# Patient Record
Sex: Female | Born: 1960 | Race: White | Hispanic: No | Smoking: Never smoker
Health system: Southern US, Community
[De-identification: ages and names within clinical notes are randomized; demographics above are authoritative.]

## PROBLEM LIST (undated history)

## (undated) DIAGNOSIS — F32A Depression, unspecified: Secondary | ICD-10-CM

## (undated) DIAGNOSIS — M5430 Sciatica, unspecified side: Secondary | ICD-10-CM

## (undated) DIAGNOSIS — F419 Anxiety disorder, unspecified: Secondary | ICD-10-CM

## (undated) DIAGNOSIS — D069 Carcinoma in situ of cervix, unspecified: Secondary | ICD-10-CM

## (undated) DIAGNOSIS — E785 Hyperlipidemia, unspecified: Secondary | ICD-10-CM

## (undated) DIAGNOSIS — K449 Diaphragmatic hernia without obstruction or gangrene: Secondary | ICD-10-CM

## (undated) DIAGNOSIS — E7800 Pure hypercholesterolemia, unspecified: Secondary | ICD-10-CM

## (undated) DIAGNOSIS — I519 Heart disease, unspecified: Secondary | ICD-10-CM

## (undated) DIAGNOSIS — F909 Attention-deficit hyperactivity disorder, unspecified type: Secondary | ICD-10-CM

## (undated) HISTORY — DX: Diaphragmatic hernia without obstruction or gangrene: K44.9

## (undated) HISTORY — PX: TONSILLECTOMY: SHX5217

## (undated) HISTORY — DX: Hyperlipidemia, unspecified: E78.5

## (undated) HISTORY — PX: OTHER SURGICAL HISTORY: SHX169

## (undated) HISTORY — DX: Pure hypercholesterolemia, unspecified: E78.00

## (undated) HISTORY — DX: Heart disease, unspecified: I51.9

## (undated) HISTORY — PX: BACK SURGERY: SHX140

## (undated) HISTORY — DX: Carcinoma in situ of cervix, unspecified: D06.9

## (undated) HISTORY — DX: Sciatica, unspecified side: M54.30

## (undated) HISTORY — PX: COLPOSCOPY: SHX161

---

## 1998-09-07 ENCOUNTER — Other Ambulatory Visit: Admission: RE | Admit: 1998-09-07 | Discharge: 1998-09-07 | Payer: Self-pay | Admitting: Gynecology

## 1999-01-06 ENCOUNTER — Encounter: Admission: RE | Admit: 1999-01-06 | Discharge: 1999-04-06 | Payer: Self-pay | Admitting: Gynecology

## 1999-03-15 ENCOUNTER — Inpatient Hospital Stay (HOSPITAL_COMMUNITY): Admission: AD | Admit: 1999-03-15 | Discharge: 1999-03-18 | Payer: Self-pay | Admitting: *Deleted

## 1999-05-01 ENCOUNTER — Other Ambulatory Visit: Admission: RE | Admit: 1999-05-01 | Discharge: 1999-05-01 | Payer: Self-pay | Admitting: Gynecology

## 2000-05-23 ENCOUNTER — Other Ambulatory Visit: Admission: RE | Admit: 2000-05-23 | Discharge: 2000-05-23 | Payer: Self-pay | Admitting: Obstetrics and Gynecology

## 2000-07-01 ENCOUNTER — Other Ambulatory Visit: Admission: RE | Admit: 2000-07-01 | Discharge: 2000-07-01 | Payer: Self-pay | Admitting: Obstetrics and Gynecology

## 2001-11-21 ENCOUNTER — Other Ambulatory Visit: Admission: RE | Admit: 2001-11-21 | Discharge: 2001-11-21 | Payer: Self-pay | Admitting: Obstetrics and Gynecology

## 2003-08-06 ENCOUNTER — Other Ambulatory Visit: Admission: RE | Admit: 2003-08-06 | Discharge: 2003-08-06 | Payer: Self-pay | Admitting: Obstetrics and Gynecology

## 2003-11-13 HISTORY — PX: ENDOMETRIAL ABLATION: SHX621

## 2003-12-09 ENCOUNTER — Ambulatory Visit (HOSPITAL_BASED_OUTPATIENT_CLINIC_OR_DEPARTMENT_OTHER): Admission: RE | Admit: 2003-12-09 | Discharge: 2003-12-09 | Payer: Self-pay | Admitting: Obstetrics and Gynecology

## 2005-02-14 ENCOUNTER — Ambulatory Visit (HOSPITAL_COMMUNITY): Admission: RE | Admit: 2005-02-14 | Discharge: 2005-02-14 | Payer: Self-pay | Admitting: Addiction Medicine

## 2005-03-14 ENCOUNTER — Other Ambulatory Visit: Admission: RE | Admit: 2005-03-14 | Discharge: 2005-03-14 | Payer: Self-pay | Admitting: Obstetrics and Gynecology

## 2006-04-04 ENCOUNTER — Ambulatory Visit (HOSPITAL_COMMUNITY): Admission: RE | Admit: 2006-04-04 | Discharge: 2006-04-04 | Payer: Self-pay | Admitting: Obstetrics and Gynecology

## 2006-04-11 ENCOUNTER — Other Ambulatory Visit: Admission: RE | Admit: 2006-04-11 | Discharge: 2006-04-11 | Payer: Self-pay | Admitting: Obstetrics and Gynecology

## 2007-04-15 ENCOUNTER — Other Ambulatory Visit: Admission: RE | Admit: 2007-04-15 | Discharge: 2007-04-15 | Payer: Self-pay | Admitting: Obstetrics and Gynecology

## 2007-04-23 ENCOUNTER — Ambulatory Visit (HOSPITAL_COMMUNITY): Admission: RE | Admit: 2007-04-23 | Discharge: 2007-04-23 | Payer: Self-pay | Admitting: Obstetrics and Gynecology

## 2008-04-16 ENCOUNTER — Other Ambulatory Visit: Admission: RE | Admit: 2008-04-16 | Discharge: 2008-04-16 | Payer: Self-pay | Admitting: Obstetrics and Gynecology

## 2008-04-23 ENCOUNTER — Ambulatory Visit (HOSPITAL_COMMUNITY): Admission: RE | Admit: 2008-04-23 | Discharge: 2008-04-23 | Payer: Self-pay | Admitting: Obstetrics and Gynecology

## 2009-05-27 ENCOUNTER — Ambulatory Visit (HOSPITAL_COMMUNITY): Admission: RE | Admit: 2009-05-27 | Discharge: 2009-05-27 | Payer: Self-pay | Admitting: Obstetrics and Gynecology

## 2009-06-06 ENCOUNTER — Encounter: Admission: RE | Admit: 2009-06-06 | Discharge: 2009-06-06 | Payer: Self-pay | Admitting: Obstetrics and Gynecology

## 2009-06-15 ENCOUNTER — Other Ambulatory Visit: Admission: RE | Admit: 2009-06-15 | Discharge: 2009-06-15 | Payer: Self-pay | Admitting: Obstetrics and Gynecology

## 2009-06-15 ENCOUNTER — Encounter: Payer: Self-pay | Admitting: Obstetrics and Gynecology

## 2009-06-15 ENCOUNTER — Ambulatory Visit: Payer: Self-pay | Admitting: Obstetrics and Gynecology

## 2010-06-07 ENCOUNTER — Ambulatory Visit (HOSPITAL_COMMUNITY): Admission: RE | Admit: 2010-06-07 | Discharge: 2010-06-07 | Payer: Self-pay | Admitting: Obstetrics and Gynecology

## 2010-06-20 ENCOUNTER — Other Ambulatory Visit: Admission: RE | Admit: 2010-06-20 | Discharge: 2010-06-20 | Payer: Self-pay | Admitting: Obstetrics and Gynecology

## 2010-06-20 ENCOUNTER — Ambulatory Visit: Payer: Self-pay | Admitting: Obstetrics and Gynecology

## 2010-06-26 ENCOUNTER — Ambulatory Visit: Payer: Self-pay | Admitting: Obstetrics and Gynecology

## 2010-07-13 ENCOUNTER — Ambulatory Visit: Payer: Self-pay | Admitting: Obstetrics and Gynecology

## 2010-12-04 ENCOUNTER — Encounter: Payer: Self-pay | Admitting: Obstetrics and Gynecology

## 2011-01-19 ENCOUNTER — Other Ambulatory Visit (INDEPENDENT_AMBULATORY_CARE_PROVIDER_SITE_OTHER): Payer: BC Managed Care – PPO

## 2011-01-19 DIAGNOSIS — E7800 Pure hypercholesterolemia, unspecified: Secondary | ICD-10-CM

## 2011-03-30 NOTE — Op Note (Signed)
NAMEALIZZON, DIOGUARDI                      ACCOUNT NO.:  [id number]   MEDICAL RECORD NO.:  [id number]                   PATIENT TYPE:  AMB   LOCATION:  [facility]                                 FACILITY:  [facility]   PHYSICIAN:  Daniel L. Eda Paschal, M.D.           DATE OF BIRTH:  01/30/1961   DATE OF PROCEDURE:  12/09/2003  DATE OF DISCHARGE:                                 OPERATIVE REPORT   PREOPERATIVE DIAGNOSIS:  Menorrhagia.   POSTOPERATIVE DIAGNOSIS:  Menorrhagia.   OPERATION:  HTA endometrial ablation.   SURGEON:  Daniel L. Eda Paschal, M.D.   ANESTHESIA:  General.   INDICATIONS:  The patient is a 50 year old gravida 2, para 2, AB 0, status  post tubal ligation, who has had persistent severe menorrhagia.  She has not  tolerated oral contraceptives and therefore now enters the hospital for  endometrial ablation.  She had a normal SIH in the office as well as a  normal endometrial biopsy.   FINDINGS:  External and vaginal is normal.  Cervix is clean.  Uterus is  retroverted, normal size and shape, with 1.5 degrees of uterine descensus.  Adnexa are palpably normal.  At the time of hysteroscopy the patient had a  completely normal endometrial cavity as well as a normal endocervix.   DESCRIPTION OF PROCEDURE:  After adequate general anesthesia the patient was  placed in the dorsal lithotomy position and prepped and draped in the usual  sterile manner.  A single-tooth tenaculum was placed in the anterior lip of  the cervix.  The cervix was just slightly dilated to a #14 Pratt dilator and  then using the HTA hysteroscope and normal saline, the pressure of the  saline opened up the canal enough that the endometrial cavity could be  entered without dilating her further, and this way a very tight seal was  obtained.  A hysteroscopic examination of the cavity showed no pathology,  and then the saline was slowly and carefully heated.  When it got to 80  degrees Centigrade, at this point  the ablation was started.  There was no  leakage for the entire 10 minutes that the ablation went on of any fluid  whatsoever.  After the 10 minutes was finished, the saline was cooled, the  cavity was inspected, and then the hysteroscope was gently removed.  There  was a bleeder from the tenaculum on the outside of the cervix, which had to  be sutured with a 2-0 Vicryl.  Estimated blood loss for the entire procedure  was less than 30 mL with none replaced.  The patient tolerated the procedure  well and left the operating room in satisfactory condition.                                               Daniel L.  Eda Paschal, M.D.    Tonette Bihari  D:  12/09/2003  T:  12/09/2003  Job:  [id number]

## 2011-07-11 DIAGNOSIS — Z8632 Personal history of gestational diabetes: Secondary | ICD-10-CM | POA: Insufficient documentation

## 2011-07-11 DIAGNOSIS — D069 Carcinoma in situ of cervix, unspecified: Secondary | ICD-10-CM | POA: Insufficient documentation

## 2011-07-20 ENCOUNTER — Encounter: Payer: BC Managed Care – PPO | Admitting: Obstetrics and Gynecology

## 2011-07-20 ENCOUNTER — Ambulatory Visit (INDEPENDENT_AMBULATORY_CARE_PROVIDER_SITE_OTHER): Payer: BC Managed Care – PPO | Admitting: Obstetrics and Gynecology

## 2011-07-20 ENCOUNTER — Encounter: Payer: Self-pay | Admitting: Obstetrics and Gynecology

## 2011-07-20 ENCOUNTER — Other Ambulatory Visit (HOSPITAL_COMMUNITY)
Admission: RE | Admit: 2011-07-20 | Discharge: 2011-07-20 | Disposition: A | Discharge status: Home / Self Care | Payer: BC Managed Care – PPO | Source: Ambulatory Visit | Attending: Obstetrics and Gynecology | Admitting: Obstetrics and Gynecology

## 2011-07-20 VITALS — BP 120/80 | Ht 67.0 in | Wt 166.0 lb

## 2011-07-20 DIAGNOSIS — Z01419 Encounter for gynecological examination (general) (routine) without abnormal findings: Secondary | ICD-10-CM

## 2011-07-20 DIAGNOSIS — E7800 Pure hypercholesterolemia, unspecified: Secondary | ICD-10-CM

## 2011-07-20 DIAGNOSIS — O24419 Gestational diabetes mellitus in pregnancy, unspecified control: Secondary | ICD-10-CM

## 2011-07-20 DIAGNOSIS — O99810 Abnormal glucose complicating pregnancy: Secondary | ICD-10-CM

## 2011-07-20 NOTE — Progress Notes (Signed)
Addended by: Cammie Mcgee T on: 07/20/2011 04:43 PM   Modules accepted: Orders

## 2011-07-20 NOTE — Progress Notes (Signed)
Patient came to see me today for an annual GYN exam. She is due for her mammogram and will make appointment. She's been very happy with her Mirena IUD. She has extremely light periods. She is due to have her cholesterol rechecked because of a borderline elevation. We've checked at several times. She did not pass today.  Physical examination: HEENT within normal limits. Neck: Thyroid not large. No masses. Supraclavicular nodes: not enlarged. Breasts: Examined in both sitting midline position. No skin changes and no masses. Abdomen: Soft no guarding rebound or masses or hernia. Pelvic: External: Within normal limits. BUS: Within normal limits. Vaginal:within normal limits. Good estrogen effect. No evidence of cystocele rectocele or enterocele. Cervix: clean. IUD string visible. Uterus: Normal size and shape. Adnexa: No masses. Rectovaginal exam: Confirmatory and negative. Extremities: Within normal limits.  Assessment: 1. Menorrhagia controlled with Mirena IUD. 2. Elevated LDL.  Plan: Return fasting for lab work. If she needs treatment for her cholesterol we need to referred to a cardiologist as she does not have a PCP.

## 2011-07-20 NOTE — Patient Instructions (Addendum)
Return for lab work. Schedule Mammogram.

## 2011-07-20 NOTE — Progress Notes (Signed)
Addended by: Cammie Mcgee T on: 07/20/2011 04:58 PM   Modules accepted: Orders

## 2011-07-23 ENCOUNTER — Other Ambulatory Visit: Payer: Self-pay | Admitting: Obstetrics and Gynecology

## 2011-07-23 DIAGNOSIS — Z1231 Encounter for screening mammogram for malignant neoplasm of breast: Secondary | ICD-10-CM

## 2011-07-30 ENCOUNTER — Other Ambulatory Visit (INDEPENDENT_AMBULATORY_CARE_PROVIDER_SITE_OTHER): Payer: BC Managed Care – PPO | Admitting: Gynecology

## 2011-07-30 DIAGNOSIS — Z01419 Encounter for gynecological examination (general) (routine) without abnormal findings: Secondary | ICD-10-CM

## 2011-07-30 DIAGNOSIS — E7800 Pure hypercholesterolemia, unspecified: Secondary | ICD-10-CM

## 2011-07-30 DIAGNOSIS — O24419 Gestational diabetes mellitus in pregnancy, unspecified control: Secondary | ICD-10-CM

## 2011-08-01 ENCOUNTER — Ambulatory Visit (HOSPITAL_COMMUNITY)
Admission: RE | Admit: 2011-08-01 | Discharge: 2011-08-01 | Disposition: A | Discharge status: Home / Self Care | Payer: BC Managed Care – PPO | Source: Ambulatory Visit | Attending: Obstetrics and Gynecology | Admitting: Obstetrics and Gynecology

## 2011-08-01 DIAGNOSIS — Z1231 Encounter for screening mammogram for malignant neoplasm of breast: Secondary | ICD-10-CM | POA: Insufficient documentation

## 2011-08-07 ENCOUNTER — Other Ambulatory Visit: Payer: Self-pay | Admitting: Obstetrics and Gynecology

## 2011-08-09 ENCOUNTER — Other Ambulatory Visit: Payer: Self-pay | Admitting: *Deleted

## 2011-08-09 MED ORDER — ACYCLOVIR 5 % EX OINT: TOPICAL_OINTMENT | CUTANEOUS | Status: DC

## 2011-08-09 NOTE — Telephone Encounter (Signed)
Okay to refill Zovirax ointment.

## 2011-08-09 NOTE — Telephone Encounter (Signed)
Patient requesting a refill on Zovirax.  She had been given a tube some time back for cold sores, and needs it refilled please.

## 2011-08-09 NOTE — Telephone Encounter (Signed)
Patient informed rx called in.

## 2011-08-15 ENCOUNTER — Other Ambulatory Visit: Payer: Self-pay | Admitting: *Deleted

## 2011-08-15 ENCOUNTER — Ambulatory Visit (HOSPITAL_COMMUNITY): Admission: RE | Admit: 2011-08-15 | Payer: BC Managed Care – PPO | Source: Ambulatory Visit

## 2011-08-15 DIAGNOSIS — N649 Disorder of breast, unspecified: Secondary | ICD-10-CM

## 2011-08-24 ENCOUNTER — Encounter: Payer: BC Managed Care – PPO | Admitting: Obstetrics and Gynecology

## 2011-08-27 ENCOUNTER — Ambulatory Visit
Admission: RE | Admit: 2011-08-27 | Discharge: 2011-08-27 | Disposition: A | Discharge status: Home / Self Care | Payer: BC Managed Care – PPO | Source: Ambulatory Visit | Attending: Obstetrics and Gynecology | Admitting: Obstetrics and Gynecology

## 2011-08-27 DIAGNOSIS — N649 Disorder of breast, unspecified: Secondary | ICD-10-CM

## 2012-06-26 ENCOUNTER — Other Ambulatory Visit: Payer: Self-pay | Admitting: *Deleted

## 2012-06-26 DIAGNOSIS — Z1231 Encounter for screening mammogram for malignant neoplasm of breast: Secondary | ICD-10-CM

## 2012-06-26 DIAGNOSIS — N6009 Solitary cyst of unspecified breast: Secondary | ICD-10-CM

## 2012-07-21 ENCOUNTER — Ambulatory Visit (INDEPENDENT_AMBULATORY_CARE_PROVIDER_SITE_OTHER): Payer: BC Managed Care – PPO | Admitting: Obstetrics and Gynecology

## 2012-07-21 ENCOUNTER — Encounter: Payer: Self-pay | Admitting: Obstetrics and Gynecology

## 2012-07-21 VITALS — BP 120/74 | Ht 66.5 in | Wt 161.0 lb

## 2012-07-21 DIAGNOSIS — E7800 Pure hypercholesterolemia, unspecified: Secondary | ICD-10-CM

## 2012-07-21 DIAGNOSIS — Z833 Family history of diabetes mellitus: Secondary | ICD-10-CM

## 2012-07-21 DIAGNOSIS — Z01419 Encounter for gynecological examination (general) (routine) without abnormal findings: Secondary | ICD-10-CM

## 2012-07-21 NOTE — Progress Notes (Signed)
Patient came to see me today for her annual GYN exam. She continues to have amenorrhea on her Mirena IUD. She has scheduled her mammogram. She has a mother who died suddenly at 49 years old. She is now jogging. She had elevated lipids last year. She thinks she eats well. She has no abnormal bleeding. She is having no pelvic pain. 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 In 2009 she had a Pap smear which showed ascus. It was repeated and still showed ascus. High risk HPV was not detected. Colposcopy with biopsy was negative. All her other Pap smears after laser of the cervix before and after 2009 were normal. Her fasting cholesterol last year was 212 with an HDL of 58 and an LDL of 144. Her triglycerides were 49. Her last Pap smear was 2012.  Physical examination:Teresa Christensen present. HEENT within normal limits. Neck: Thyroid not large. No masses. Supraclavicular nodes: not enlarged. Breasts: Examined in both sitting and lying  position. No skin changes and no masses. Abdomen: Soft no guarding rebound or masses or hernia. Pelvic: External: Within normal limits. BUS: Within normal limits. Vaginal:within normal limits. Good estrogen effect. No evidence of cystocele rectocele or enterocele. Cervix: clean. IUD string visible. Uterus: Normal size and shape. Adnexa: No masses. Rectovaginal exam: Confirmatory and negative. Extremities: Within normal limits.  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 Elevated lipids with mother who died of coronary artery disease at 57  Plan: Mammogram. Return fasting for lipid profile. If the profile normal schedule cardiac stress test through PCP. If lipids elevated refer to  Cardiologist. The new Pap smear guidelines were discussed with the patient. No pap  done.

## 2012-07-21 NOTE — Patient Instructions (Signed)
Return fasting for lipid profile.

## 2012-07-22 LAB — URINALYSIS W MICROSCOPIC + REFLEX CULTURE
Bacteria, UA: NONE SEEN
Bilirubin Urine: NEGATIVE
Casts: NONE SEEN
Crystals: NONE SEEN
Glucose, UA: NEGATIVE mg/dL
Hgb urine dipstick: NEGATIVE
Ketones, ur: NEGATIVE mg/dL
Specific Gravity, Urine: 1.01 (ref 1.005–1.030)
pH: 6 (ref 5.0–8.0)

## 2012-07-24 LAB — URINE CULTURE: Colony Count: 75000

## 2012-07-25 ENCOUNTER — Other Ambulatory Visit: Payer: Self-pay | Admitting: Obstetrics and Gynecology

## 2012-07-25 DIAGNOSIS — N390 Urinary tract infection, site not specified: Secondary | ICD-10-CM

## 2012-07-25 MED ORDER — AMOXICILLIN 500 MG PO CAPS: 500.0000 mg | ORAL_CAPSULE | Freq: Three times a day (TID) | ORAL | Status: AC

## 2012-08-04 ENCOUNTER — Ambulatory Visit
Admission: RE | Admit: 2012-08-04 | Discharge: 2012-08-04 | Disposition: A | Discharge status: Home / Self Care | Payer: BC Managed Care – PPO | Source: Ambulatory Visit | Attending: Obstetrics and Gynecology | Admitting: Obstetrics and Gynecology

## 2012-08-04 DIAGNOSIS — N6009 Solitary cyst of unspecified breast: Secondary | ICD-10-CM

## 2012-08-04 DIAGNOSIS — Z1231 Encounter for screening mammogram for malignant neoplasm of breast: Secondary | ICD-10-CM

## 2012-08-07 ENCOUNTER — Other Ambulatory Visit: Payer: BC Managed Care – PPO

## 2012-08-07 DIAGNOSIS — N390 Urinary tract infection, site not specified: Secondary | ICD-10-CM

## 2012-08-07 DIAGNOSIS — Z01419 Encounter for gynecological examination (general) (routine) without abnormal findings: Secondary | ICD-10-CM

## 2012-08-07 DIAGNOSIS — Z833 Family history of diabetes mellitus: Secondary | ICD-10-CM

## 2012-08-07 DIAGNOSIS — E7800 Pure hypercholesterolemia, unspecified: Secondary | ICD-10-CM

## 2012-08-07 LAB — CBC WITH DIFFERENTIAL/PLATELET
Basophils Absolute: 0 10*3/uL (ref 0.0–0.1)
Basophils Relative: 1 % (ref 0–1)
MCHC: 35.3 g/dL (ref 30.0–36.0)
Monocytes Relative: 6 % (ref 3–12)
Neutro Abs: 4 10*3/uL (ref 1.7–7.7)
Neutrophils Relative %: 62 % (ref 43–77)
RBC: 4.35 MIL/uL (ref 3.87–5.11)
WBC: 6.4 10*3/uL (ref 4.0–10.5)

## 2012-08-07 LAB — HEMOGLOBIN A1C
Hgb A1c MFr Bld: 5.7 % — ABNORMAL HIGH (ref <5.7)
Mean Plasma Glucose: 117 mg/dL — ABNORMAL HIGH (ref <117)

## 2012-08-07 LAB — LIPID PANEL
Cholesterol: 203 mg/dL — ABNORMAL HIGH (ref 0–200)
HDL: 60 mg/dL (ref >39)
Total CHOL/HDL Ratio: 3.4 Ratio

## 2012-08-09 LAB — URINE CULTURE
Colony Count: NO GROWTH
Organism ID, Bacteria: NO GROWTH

## 2013-03-03 ENCOUNTER — Other Ambulatory Visit (HOSPITAL_BASED_OUTPATIENT_CLINIC_OR_DEPARTMENT_OTHER): Payer: Self-pay | Admitting: Family Medicine

## 2013-03-03 ENCOUNTER — Ambulatory Visit (HOSPITAL_BASED_OUTPATIENT_CLINIC_OR_DEPARTMENT_OTHER)
Admission: RE | Admit: 2013-03-03 | Discharge: 2013-03-03 | Disposition: A | Discharge status: Home / Self Care | Payer: BC Managed Care – PPO | Source: Ambulatory Visit | Attending: Family Medicine | Admitting: Family Medicine

## 2013-03-03 DIAGNOSIS — N201 Calculus of ureter: Secondary | ICD-10-CM | POA: Insufficient documentation

## 2013-03-03 DIAGNOSIS — R109 Unspecified abdominal pain: Secondary | ICD-10-CM

## 2013-03-03 DIAGNOSIS — N200 Calculus of kidney: Secondary | ICD-10-CM | POA: Insufficient documentation

## 2013-03-12 ENCOUNTER — Other Ambulatory Visit: Payer: Self-pay | Admitting: Obstetrics and Gynecology

## 2013-06-29 ENCOUNTER — Other Ambulatory Visit: Payer: Self-pay | Admitting: Gynecology

## 2013-06-29 DIAGNOSIS — N6320 Unspecified lump in the left breast, unspecified quadrant: Secondary | ICD-10-CM

## 2013-08-10 ENCOUNTER — Encounter: Payer: Self-pay | Admitting: Gynecology

## 2013-08-10 ENCOUNTER — Ambulatory Visit
Admission: RE | Admit: 2013-08-10 | Discharge: 2013-08-10 | Disposition: A | Discharge status: Home / Self Care | Payer: BC Managed Care – PPO | Source: Ambulatory Visit | Attending: Gynecology | Admitting: Gynecology

## 2013-08-10 ENCOUNTER — Other Ambulatory Visit: Payer: BC Managed Care – PPO

## 2013-08-10 DIAGNOSIS — N6320 Unspecified lump in the left breast, unspecified quadrant: Secondary | ICD-10-CM

## 2013-08-12 DIAGNOSIS — E785 Hyperlipidemia, unspecified: Secondary | ICD-10-CM

## 2013-08-12 HISTORY — DX: Hyperlipidemia, unspecified: E78.5

## 2013-08-21 ENCOUNTER — Other Ambulatory Visit (HOSPITAL_COMMUNITY)
Admission: RE | Admit: 2013-08-21 | Discharge: 2013-08-21 | Disposition: A | Discharge status: Home / Self Care | Payer: BC Managed Care – PPO | Source: Ambulatory Visit | Attending: Gynecology | Admitting: Gynecology

## 2013-08-21 ENCOUNTER — Encounter: Payer: Self-pay | Admitting: Gynecology

## 2013-08-21 ENCOUNTER — Ambulatory Visit (INDEPENDENT_AMBULATORY_CARE_PROVIDER_SITE_OTHER): Payer: BC Managed Care – PPO | Admitting: Gynecology

## 2013-08-21 VITALS — BP 124/70 | Ht 66.25 in | Wt 169.0 lb

## 2013-08-21 DIAGNOSIS — Z1151 Encounter for screening for human papillomavirus (HPV): Secondary | ICD-10-CM | POA: Insufficient documentation

## 2013-08-21 DIAGNOSIS — Z01419 Encounter for gynecological examination (general) (routine) without abnormal findings: Secondary | ICD-10-CM

## 2013-08-21 DIAGNOSIS — Z23 Encounter for immunization: Secondary | ICD-10-CM

## 2013-08-21 DIAGNOSIS — Z1159 Encounter for screening for other viral diseases: Secondary | ICD-10-CM

## 2013-08-21 NOTE — Patient Instructions (Signed)
Influenza Vaccine (Flu Vaccine, Inactivated) 2013 2014 What You Need to Know WHY GET VACCINATED?  Influenza ("flu") is a contagious disease that spreads around the [place] every winter, usually between October and May.  Flu is caused by the influenza virus, and can be spread by coughing, sneezing, and close contact.  Anyone can get flu, but the risk of getting flu is highest among children. Symptoms come on suddenly and may last several days. They can include:  Fever or chills.  Sore throat.  Muscle aches.  Fatigue.  Cough.  Headache.  Runny or stuffy nose. Flu can make some people much sicker than others. These people include young children, people 65 and older, pregnant women, and people with certain health conditions such as heart, lung or kidney disease, or a weakened immune system. Flu vaccine is especially important for these people, and anyone in close contact with them. Flu can also lead to pneumonia, and make existing medical conditions worse. It can cause diarrhea and seizures in children. Each year thousands of people in the [place] die from flu, and many more are hospitalized. Flu vaccine is the best protection we have from flu and its complications. Flu vaccine also helps prevent spreading flu from person to person. INACTIVATED FLU VACCINE There are 2 types of influenza vaccine:  You are getting an inactivated flu vaccine, which does not contain any live influenza virus. It is given by injection with a needle, and often called the "flu shot."  A different live, attenuated (weakened) influenza vaccine is sprayed into the nostrils. This vaccine is described in a separate Vaccine Information Statement. Flu vaccine is recommended every year. Children 6 months through 8 years of age should get 2 doses the first year they get vaccinated. Flu viruses are always changing. Each year's flu vaccine is made to protect from viruses that are most likely to cause disease  that year. While flu vaccine cannot prevent all cases of flu, it is our best defense against the disease. Inactivated flu vaccine protects against 3 or 4 different influenza viruses. It takes about 2 weeks for protection to develop after the vaccination, and protection lasts several months to a year. Some illnesses that are not caused by influenza virus are often mistaken for flu. Flu vaccine will not prevent these illnesses. It can only prevent influenza. A "high-dose" flu vaccine is available for people 65 years of age and older. The person giving you the vaccine can tell you more about it. Some inactivated flu vaccine contains a very small amount of a mercury-based preservative called thimerosal. Studies have shown that thimerosal in vaccines is not harmful, but flu vaccines that do not contain a preservative are available. SOME PEOPLE SHOULD NOT GET THIS VACCINE Tell the person who gives you the vaccine:  If you have any severe (life-threatening) allergies. If you ever had a life-threatening allergic reaction after a dose of flu vaccine, or have a severe allergy to any part of this vaccine, you may be advised not to get a dose. Most, but not all, types of flu vaccine contain a small amount of egg.  If you ever had Guillain Barr Syndrome (a severe paralyzing illness, also called GBS). Some people with a history of GBS should not get this vaccine. This should be discussed with your doctor.  If you are not feeling well. They might suggest waiting until you feel better. But you should come back. RISKS OF A VACCINE REACTION With a vaccine, like any medicine, there   is a chance of side effects. These are usually mild and go away on their own. Serious side effects are also possible, but are very rare. Inactivated flu vaccine does not contain live flu virus, sogetting flu from this vaccine is not possible. Brief fainting spells and related symptoms (such as jerking movements) can happen after any medical  procedure, including vaccination. Sitting or lying down for about 15 minutes after a vaccination can help prevent fainting and injuries caused by falls. Tell your doctor if you feel dizzy or lightheaded, or have vision changes or ringing in the ears. Mild problems following inactivated flu vaccine:  Soreness, redness, or swelling where the shot was given.  Hoarseness; sore, red or itchy eyes; or cough.  Fever.  Aches.  Headache.  Itching.  Fatigue. If these problems occur, they usually begin soon after the shot and last 1 or 2 days. Moderate problems following inactivated flu vaccine:  Young children who get inactivated flu vaccine and pneumococcal vaccine (PCV13) at the same time may be at increased risk for seizures caused by fever. Ask your doctor for more information. Tell your doctor if a child who is getting flu vaccine has ever had a seizure. Severe problems following inactivated flu vaccine:  A severe allergic reaction could occur after any vaccine (estimated less than 1 in a million doses).  There is a small possibility that inactivated flu vaccine could be associated with Guillan Barr Syndrome (GBS), no more than 1 or 2 cases per million people vaccinated. This is much lower than the risk of severe complications from flu, which can be prevented by flu vaccine. The safety of vaccines is always being monitored. For more information, visit: www.cdc.gov/vaccinesafety/ WHAT IF THERE IS A SERIOUS REACTION? What should I look for?  Look for anything that concerns you, such as signs of a severe allergic reaction, very high fever, or behavior changes. Signs of a severe allergic reaction can include hives, swelling of the face and throat, difficulty breathing, a fast heartbeat, dizziness, and weakness. These would start a few minutes to a few hours after the vaccination. What should I do?  If you think it is a severe allergic reaction or other emergency that cannot wait, call 9 1 1  or get the person to the nearest hospital. Otherwise, call your doctor.  Afterward, the reaction should be reported to the Vaccine Adverse Event Reporting System (VAERS). Your doctor might file this report, or you can do it yourself through the VAERS website at www.vaers.hhs.gov, or by calling 1-800-822-7967. VAERS is only for reporting reactions. They do not give medical advice. THE NATIONAL VACCINE INJURY COMPENSATION PROGRAM The National Vaccine Injury Compensation Program (VICP) is a federal program that was created to compensate people who may have been injured by certain vaccines. Persons who believe they may have been injured by a vaccine can learn about the program and about filing a claim by calling 1-800-338-2382 or visiting the VICP website at www.hrsa.gov/vaccinecompensation HOW CAN I LEARN MORE?  Ask your doctor.  Call your local or state health department.  Contact the Centers for Disease Control and Prevention (CDC):  Call 1-800-232-4636 (1-800-CDC-INFO) or  Visit CDC's website at www.cdc.gov/flu CDC Inactivated Influenza Vaccine Interim VIS (06/06/12) Document Released: 08/23/2006 Document Revised: 07/23/2012 Document Reviewed: 06/06/2012 Exit[employer] Patient Information 2014 [employer].  

## 2013-08-21 NOTE — Progress Notes (Signed)
Teresa Christensen Mar 28, 1961 [id number]   History:    52 y.o.  for annual gyn exam with no complaints today. Patient was last seen in the office in September 2013. She is doing well with a Mirena IUD there was placing 2011 and is having no menstrual cycles. 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 In 2009 she had a Pap smear which showed ascus. It was repeated and still showed ascus. High risk HPV was not detected. Colposcopy with biopsy was negative. All her other Pap smears after laser of the cervix before and after 2009 were normal. Her fasting cholesterol last year was 212 with an HDL of 58 and an LDL of 144. Her triglycerides were 49. Her last Pap smear was 2012.   Patient's mammogram and ultrasound this year with the following results:  Ultrasound is performed, showing a circumscribed ovoid  homogeneously hypoechoic solid mass with increased through  transmission at the 3 o'clock position of the left breast 3 cm from  the nipple. 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   IMPRESSION:  Stable 5 x 7 x 9 mm well defined ovoid homogeneously hypoechoic  solid mass at the 3 o'clock position of the left breast 3 cm from  the nipple. 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     Past medical history,surgical history, family history and social history were all reviewed and documented in the EPIC chart.  Gynecologic History No LMP recorded. Patient is not currently having periods (Reason: IUD). Contraception: IUD Last Pap: 2012. Results were: normal Last mammogram: see above. Results were: see above  Obstetric History OB History  Gravida Para Term Preterm AB SAB TAB Ectopic Multiple Living  3 2   1     2     # Outcome Date GA Lbr Len/2nd Weight Sex Delivery Anes PTL Lv  3 ABT           2 PAR           1 PAR                ROS: A ROS was performed  and pertinent positives and negatives are included in the history.  GENERAL: No fevers or chills. HEENT: No change in vision, no earache, sore throat or sinus congestion. NECK: No pain or stiffness. CARDIOVASCULAR: No chest pain or pressure. No palpitations. PULMONARY: No shortness of breath, cough or wheeze. GASTROINTESTINAL: No abdominal pain, nausea, vomiting or diarrhea, melena or bright red blood per rectum. GENITOURINARY: No urinary frequency, urgency, hesitancy or dysuria. MUSCULOSKELETAL: No joint or muscle pain, no back pain, no recent trauma. DERMATOLOGIC: No rash, no itching, no lesions. ENDOCRINE: No polyuria, polydipsia, no heat or cold intolerance. No recent change in weight. HEMATOLOGICAL: No anemia or easy bruising or bleeding. NEUROLOGIC: No headache, seizures, numbness, tingling or weakness. PSYCHIATRIC: No depression, no loss of interest in normal activity or change in sleep pattern.     Exam: chaperone present  BP 124/70  Ht 5' 6.25" (1.683 m)  Wt 169 lb (76.658 kg)  BMI 27.06 kg/m2  Body mass index is 27.06 kg/(m^2).  General appearance : Well developed well nourished female. No acute distress HEENT: Neck supple, trachea midline, no carotid bruits, no thyroidmegaly Lungs: Clear to auscultation, no rhonchi or wheezes, or rib retractions  Heart: Regular rate and rhythm, no  murmurs or gallops Breast:Examined in sitting and supine position were symmetrical in appearance, no palpable masses or tenderness,  no skin retraction, no nipple inversion, no nipple discharge, no skin discoloration, no axillary or supraclavicular lymphadenopathy Abdomen: no palpable masses or tenderness, no rebound or guarding Extremities: no edema or skin discoloration or tenderness  Pelvic:  Bartholin, Urethra, Skene Glands: Within normal limits             Vagina: No gross lesions or discharge  Cervix: No gross lesions or discharge, IUD string seen  Uterus  anteverted, normal size, shape and  consistency, non-tender and mobile  Adnexa  Without masses or tenderness  Anus and perineum  normal   Rectovaginal  normal sphincter tone without palpated masses or tenderness             Hemoccult none indicated     Assessment/Plan:  52 y.o. female for annual exam who was reminded on importance of regular exercise, calcium and vitamin D for osteoporosis prevention. Pap smear was done today. The following labs were ordered: CBC, fasting lipid profile, TSH, comprehensive metabolic panel, urinalysis and flu vaccine.Patient will needs screening colonoscopy next year.  New CDC guidelines is recommending patients be tested once in her lifetime for hepatitis C antibody who were born between [birth year] through 1963. This was discussed with the patient today and has agreed to be tested today.  Note: This dictation was prepared with  Dragon/digital dictation along withSmart phrase technology. Any transcriptional errors that result from this process are unintentional.   Ok Edwards MD, 5:37 PM 08/21/2013

## 2013-09-02 ENCOUNTER — Other Ambulatory Visit: Payer: BC Managed Care – PPO

## 2013-09-02 DIAGNOSIS — Z01419 Encounter for gynecological examination (general) (routine) without abnormal findings: Secondary | ICD-10-CM

## 2013-09-02 DIAGNOSIS — Z1159 Encounter for screening for other viral diseases: Secondary | ICD-10-CM

## 2013-09-02 LAB — CBC WITH DIFFERENTIAL/PLATELET
Basophils Absolute: 0 10*3/uL (ref 0.0–0.1)
Basophils Relative: 1 % (ref 0–1)
Hemoglobin: 13.5 g/dL (ref 12.0–15.0)
MCHC: 35.2 g/dL (ref 30.0–36.0)
Neutro Abs: 4.5 10*3/uL (ref 1.7–7.7)
Neutrophils Relative %: 66 % (ref 43–77)
Platelets: 227 10*3/uL (ref 150–400)
RDW: 13.2 % (ref 11.5–15.5)

## 2013-09-02 LAB — COMPREHENSIVE METABOLIC PANEL
AST: 13 U/L (ref 0–37)
Albumin: 3.8 g/dL (ref 3.5–5.2)
Alkaline Phosphatase: 38 U/L — ABNORMAL LOW (ref 39–117)
Potassium: 4.5 mEq/L (ref 3.5–5.3)
Sodium: 140 mEq/L (ref 135–145)
Total Protein: 6 g/dL (ref 6.0–8.3)

## 2013-09-02 LAB — LIPID PANEL
HDL: 53 mg/dL (ref >39)
LDL Cholesterol: 135 mg/dL — ABNORMAL HIGH (ref 0–99)
VLDL: 10 mg/dL (ref 0–40)

## 2013-09-02 LAB — TSH: TSH: 1.186 u[IU]/mL (ref 0.350–4.500)

## 2013-09-04 ENCOUNTER — Other Ambulatory Visit: Payer: Self-pay | Admitting: *Deleted

## 2013-09-04 DIAGNOSIS — E7800 Pure hypercholesterolemia, unspecified: Secondary | ICD-10-CM

## 2013-09-12 HISTORY — PX: OTHER SURGICAL HISTORY: SHX169

## 2013-09-15 ENCOUNTER — Ambulatory Visit (INDEPENDENT_AMBULATORY_CARE_PROVIDER_SITE_OTHER): Payer: BC Managed Care – PPO | Admitting: Cardiology

## 2013-09-15 ENCOUNTER — Encounter: Payer: Self-pay | Admitting: Cardiology

## 2013-09-15 VITALS — BP 110/76 | HR 60 | Ht 67.0 in | Wt 170.0 lb

## 2013-09-15 DIAGNOSIS — E785 Hyperlipidemia, unspecified: Secondary | ICD-10-CM | POA: Insufficient documentation

## 2013-09-15 DIAGNOSIS — Z8249 Family history of ischemic heart disease and other diseases of the circulatory system: Secondary | ICD-10-CM | POA: Insufficient documentation

## 2013-09-15 DIAGNOSIS — R0609 Other forms of dyspnea: Secondary | ICD-10-CM | POA: Insufficient documentation

## 2013-09-15 NOTE — Patient Instructions (Signed)
Bike test  Here in the office.  Your physician recommends that you schedule a follow-up appointment  After CPET/MET is completed.

## 2013-09-15 NOTE — Progress Notes (Signed)
PCP: Lenora Boys, MD  Clinic Note: Chief Complaint  Patient presents with  . Follow-up    high LDL    HPI: Teresa Christensen is a 52 y.o. female with a PMH below who presents today for evaluation of hyperlipidemia in setting of family history of early MI in mother at age 33.  Also with some decreased exercise.  Patient states she has had no symptoms such as chest pain, dizziness, lightheadedness, dizziness.  She has noted some decreased exercise tolerance with some mild exertional dyspnea of late, but denies fatigue. She states 2 years ago her cholesterol was checked by her ob/gyn and her LDL was elevated although she annot recall specific numbers. She states her total cholesterol and HDL were "normal at that time". She is a very active person and recently ran a half marathon. She exercises 3-4x a week and maintains a BMI <27.5. She tries to eat healthy including eating a large portion of fruits and vegetables.   At her most recent visit with PCP office, she was noted  To have "LDL was 135, otherwise normal". Patient was told her total cholesterol and HDL were excellent.  Patient was very concerned that her LDL had not improved with her active lifestyle and given the fact that her mother had an MI at 29  (when patient was 4) and she is now 48. This topic is a very emotional one for her. Of note, her mother was obese, inactive, a smoker, likely had higher cholesterol levels and may have had high blood pressure as well.   The remainder of cardiac review of systems is as follows: Cardiovascular ROS: positive for - dyspnea on exertion and mildly decreased exercise tolerance negative for - chest pain, edema, irregular heartbeat, loss of consciousness, murmur, orthopnea, palpitations, paroxysmal nocturnal dyspnea, rapid heart rate or shortness of breath,   Past Medical History  Diagnosis Date  . CIN III (cervical intraepithelial neoplasia grade III) with severe dysplasia   . High cholesterol      Prior Cardiac Evaluation and Past Surgical History: Past Surgical History  Procedure Laterality Date  . Tonsillectomy    . Laser vaporization of cervix    . Endometrial ablation  11/2003  . Intrauterine device insertion  07/2010    mirena  . Colposcopy      No Known Allergies  Current Outpatient Prescriptions  Medication Sig Dispense Refill  . cetirizine (ZYRTEC) 10 MG tablet Take 10 mg by mouth daily. prn      . Ibuprofen (ADVIL PO) Take by mouth. PRN       . levonorgestrel (MIRENA) 20 MCG/24HR IUD 1 each by Intrauterine route once.        . Multiple Vitamin (MULTIVITAMIN) capsule Take 1 capsule by mouth daily.        . Levocetirizine Dihydrochloride (XYZAL PO) Take by mouth. PRN      . ZOVIRAX 5 % APPLY TOPICALLY EVERY 3 (THREE) HOURS.  30 g  2   No current facility-administered medications for this visit.   Social History  . Marital Status: Married  Has 2 children age 10 and 39  Social History Main Topics  . Smoking status: Never Smoker   . Smokeless tobacco: Never Used  . Alcohol Use: 1.0 oz/week    2 drink(s) per week  . Drug Use: No  . Sexual Activity: Yes    Birth Control/ Protection: IUD     Comment: Mirena inserted 07-13-10   Family History  Problem Relation  Age of Onset  . Hypertension Mother   . Heart disease Mother     DIED WITH MI AT AGE 69--WAS A SMOKER  . Hypertension Father   . Hypertension Sister   . Multiple sclerosis Brother   . Breast cancer Maternal Grandmother     Age 25    ROS: A comprehensive Review of Systems - was negative  PHYSICAL EXAM BP 110/76  Pulse 60  Ht 5\' 7"  (1.702 m)  Wt 170 lb (77.111 kg)  BMI 26.62 kg/m2 Gen: NAD, resting comfortably on table, appears stated age Neck: no carotid bruit CV: regular rate and rhythm, no murmurs rubs or gallops.  Pulm: clear to auscultation bilaterally Ext: no edema Neuro: moves all extremities, grossly intact  EKG:  Date: 09/15/2013  EKG Time: 9:21 AM  Rate: 60  Rhythm: normal  sinus rhythm,  normal EKG, normal sinus rhythm  Axis: normal  Intervals:normal intervals  ST&T Change: none noted  Narrative Interpretation: normal EKG in NSR with no signs of ischemia   Recent Labs: From PCP note "LDL was 135, otherwise normal"  ASSESSMENT / PLAN:  Hyperlipidemia Cannot formally calculate ASCVD Risk but using HDL of 50 and total cholesterol of 190, risk would be 1% and statin would not be recommended over diet & exercise. In addition, per former guidelines and only risk factor of family history, would recommend LDL less than 160 which patient has obtained. Patient should continue efforts at healthy lifestyle modifications through diet and exercise. Agree with PCP to not initiate statin at this time but this can be followed yearly by PCP.   Family history early MI - with concern for decreased exercise tolerance Believe patient risk of CV event is low given that she is taking care of herself (not smoking like her mother did, exercising, eating well). Given hyperlipidemia and family history of MI, have discussed with patient and opted to proceed forward with Cardiopulmonary Exercise Testing (CPET-MET test).   Followup: prn or if any concerning findings found on CPET  Dr. Herbie Baltimore has seen and evaluated the patient. We have discussed the history, exam, assessment, and plan as noted above. He agrees with management.   Aldine Contes. Marti Sleigh, MD, PGY3 [facility] 09/15/2013 9:29 AM   ATTENDING ATTESTATION I seen and examined the patient along with Dr. Durene Cal. I reviewed her chart as well as his findings. I agree with his findings, examination, as well as impression and recommendations. We have discussed the plan as noted above. Additional problem list based plan below.  Hyperlipemia Very portal line numbers. I agree with Dr. Durene Cal as far as the patient's risk calculation. I would like to risk stratify her from a physiological standpoint.  Plan: For  now continue with lifestyle modification for lipid control. Her HDL was fine, and her LDL is perhaps borderline elevated. Would preserve statin therapy if she were to have worsening lipid panel, or abnormal findings on CPET (Cardiopulmonary Exercise Test)  Exertional dyspnea My estimation of this decreased exercise tolerance and exertional dyspnea is probably just because she has been doing less exercise of late than she used to. The patient like to get back to doing her routine exercise, but is fearful of her cardiovascular risk given her family history.  I feel that determining her baseline cardiovascular risk when there is equivocal/borderline lipid findings and family history of premature coronary disease is a reasonable objective. I do best type of tests to give [place] a physiological data to risk out  by her from a cardiac standpoint would be a Cardiopulmonary Exercise Test (CPET-MET)  Family history of early CAD This is really her major cardiovascular risk factor besides gestational diabetes. However as she is approaching needs that her mother was when she had her first MI, and there is definite fear that she would suffer the same fate.    Risk stratify with  CPET-MET  Pending the outcome of her stress test, I agree with when necessary follow-up.    DAVID W. Herbie Baltimore, M.D., M.S. [facility] [address] [place], [place]  [address]  [phone] Pager # [phone]

## 2013-09-15 NOTE — Assessment & Plan Note (Signed)
My estimation of this decreased exercise tolerance and exertional dyspnea is probably just because she has been doing less exercise of late than she used to. The patient like to get back to doing her routine exercise, but is fearful of her cardiovascular risk given her family history.  I feel that determining her baseline cardiovascular risk when there is equivocal/borderline lipid findings and family history of premature coronary disease is a reasonable objective. I do best type of tests to give [place] a physiological data to risk out by her from a cardiac standpoint would be a Cardiopulmonary Exercise Test (CPET-MET)

## 2013-09-15 NOTE — Assessment & Plan Note (Signed)
Very portal line numbers. I agree with Dr. Durene Cal as far as the patient's risk calculation. I would like to risk stratify her from a physiological standpoint.  Plan: For now continue with lifestyle modification for lipid control. Her HDL was fine, and her LDL is perhaps borderline elevated. Would preserve statin therapy if she were to have worsening lipid panel, or abnormal findings on CPET (Cardiopulmonary Exercise Test)

## 2013-09-15 NOTE — Assessment & Plan Note (Addendum)
This is really her major cardiovascular risk factor besides gestational diabetes. However as she is approaching needs that her mother was when she had her first MI, and there is definite fear that she would suffer the same fate.    Risk stratify with  CPET-MET  Pending the outcome of her stress test, I agree with when necessary follow-up.

## 2013-09-28 ENCOUNTER — Encounter (INDEPENDENT_AMBULATORY_CARE_PROVIDER_SITE_OTHER): Payer: BC Managed Care – PPO

## 2013-09-28 DIAGNOSIS — Z8249 Family history of ischemic heart disease and other diseases of the circulatory system: Secondary | ICD-10-CM

## 2013-09-28 DIAGNOSIS — E785 Hyperlipidemia, unspecified: Secondary | ICD-10-CM

## 2013-09-28 DIAGNOSIS — R9439 Abnormal result of other cardiovascular function study: Secondary | ICD-10-CM | POA: Insufficient documentation

## 2013-09-28 DIAGNOSIS — R0602 Shortness of breath: Secondary | ICD-10-CM

## 2013-09-29 ENCOUNTER — Encounter: Payer: Self-pay | Admitting: Cardiology

## 2013-10-05 ENCOUNTER — Encounter: Payer: Self-pay | Admitting: Cardiology

## 2013-10-05 ENCOUNTER — Ambulatory Visit (INDEPENDENT_AMBULATORY_CARE_PROVIDER_SITE_OTHER): Payer: BC Managed Care – PPO | Admitting: Cardiology

## 2013-10-05 VITALS — BP 136/78 | HR 65 | Ht 66.5 in | Wt 172.0 lb

## 2013-10-05 DIAGNOSIS — Z79899 Other long term (current) drug therapy: Secondary | ICD-10-CM

## 2013-10-05 DIAGNOSIS — E782 Mixed hyperlipidemia: Secondary | ICD-10-CM

## 2013-10-05 DIAGNOSIS — E7800 Pure hypercholesterolemia, unspecified: Secondary | ICD-10-CM

## 2013-10-05 DIAGNOSIS — R9439 Abnormal result of other cardiovascular function study: Secondary | ICD-10-CM

## 2013-10-05 DIAGNOSIS — Z8249 Family history of ischemic heart disease and other diseases of the circulatory system: Secondary | ICD-10-CM

## 2013-10-05 MED ORDER — ATORVASTATIN CALCIUM 10 MG PO TABS: 10.0000 mg | ORAL_TABLET | Freq: Every day | ORAL | Status: DC

## 2013-10-05 NOTE — Patient Instructions (Signed)
START Atorvastatin 10mg  daily.    Dr. Herbie Baltimore recommends that you return for lab work in: 3 months.  Dr. Herbie Baltimore recommends that you schedule a follow-up appointment in: ONE YEAR.

## 2013-10-06 ENCOUNTER — Encounter: Payer: Self-pay | Admitting: Cardiology

## 2013-10-06 NOTE — Assessment & Plan Note (Signed)
Low risk CPET test, however there was suggestion of exercise induced myocardial dysfunction that would be concerning for possible microvascular ischemia. This could potentially be a precursor to microvascular ischemia. We'll need to continue to monitor and work on respect to modification.

## 2013-10-06 NOTE — Assessment & Plan Note (Addendum)
Despite the fact that she did so well, there are some somewhat concerning aspects of the study. The fact that is not completely normal would suggest that there is potential underlying cardiac abnormality. This would be more consistent with either diastolic dysfunction or microvascular ischemia versus structural abnormalities.  Plan: Check 2-D echocardiogram  With this finding, I think we need to take the LDL not quite being at goal more seriously. Meaning that we would want to increase risk factor modification. She's been trying diet and exercise for 2 years without any significant benefit. I will start her on low-dose atorvastatin. We'll recheck labs in about 3 months.  Would also consider OTC L-arginine and co-Q10.  Continue exercise and dietary modification

## 2013-10-06 NOTE — Progress Notes (Signed)
PCP: Lenora Boys, MD  Clinic Note: Chief Complaint  Patient presents with  . Follow-up    MET test results.  Denies chest pain, SOB, edema, dizziness or palpitations.    HPI: Teresa Christensen is a 52 y.o. female with a PMH below who presents today for MET test follow up.  She was seen roughly 3 weeks ago for evaluation of hyperlipidemia in setting of family history of early MI in mother at age 6.  Also with some decreased exercise. Do to decreased exercise tolerance, negative family history of coronary disease and her cholesterol levels. We chose to risk stratify from cardiovascular standpoint with a CPET-MET-TEST. She did fairly well her CPET test as far as exercise tolerance and peak VO2 levels, however she did have divergence of the heart rate/stroke volume curve with increasing work rate and decreasing stroke volume corresponding and brought decreasing cardiac output. This would be consistent with exercise-induced myocardial dysfunction during the last 5 minutes of exercise.  Interestingly, however with excellent anaerobic depression old and peak VO2, she was to consider low risk  Interval History: She continues to deny any symptoms of chest tightness or pressure with rest or exertion. She has some decreased exercise tolerance as mentioned in the initial note.  The remainder of cardiac review of systems is as follows: Cardiovascular ROS: positive for - mildly decreased exercise tolerance negative for - chest pain, dyspnea on exertion, edema, irregular heartbeat, loss of consciousness, murmur, orthopnea, palpitations, paroxysmal nocturnal dyspnea, rapid heart rate or shortness of breath,  No lightheadedness, dizziness, weakness or syncope/near syncope. No TIA or amaurosis fugax in his. No claudication symptoms.  Past Medical History  Diagnosis Date  . CIN III (cervical intraepithelial neoplasia grade III) with severe dysplasia   . High cholesterol October 2014    Total  cholesterol 198, LDL 135, HDL 53   Prior Cardiac Evaluation and Past Surgical History: Past Surgical History  Procedure Laterality Date  . Tonsillectomy    . Laser vaporization of cervix    . Endometrial ablation  11/2003  . Intrauterine device insertion  07/2010    mirena  . Colposcopy    . Cpet-met-test  November 2014    Effort (RER): 1.18 (>target); peak HR 166 bpm (87% max predicted); HR/Stroke Vol curve discordance (last 5.5 min drop in CO with increasing HR) -> exercise-induced myocardial dysfunction; low risk based on high anaerobic threshold and peak VO2    No Known Allergies  Current Outpatient Prescriptions  Medication Sig Dispense Refill  . cetirizine (ZYRTEC) 10 MG tablet Take 10 mg by mouth daily. prn      . Ibuprofen (ADVIL PO) Take by mouth. PRN       . levonorgestrel (MIRENA) 20 MCG/24HR IUD 1 each by Intrauterine route once.        . Multiple Vitamin (MULTIVITAMIN) capsule Take 1 capsule by mouth daily.        Marland Kitchen ZOVIRAX 5 % APPLY TOPICALLY EVERY 3 (THREE) HOURS.  30 g  2  . atorvastatin (LIPITOR) 10 MG tablet Take 1 tablet (10 mg total) by mouth daily.  30 tablet  5   No current facility-administered medications for this visit.   Social History  . Marital Status: Married  Has 2 children age 83 and 58  Social History Main Topics  . Smoking status: Never Smoker   . Smokeless tobacco: Never Used  . Alcohol Use: 1.0 oz/week    2 drink(s) per week  . Drug Use: No  .  Sexual Activity: Yes    Birth Control/ Protection: IUD     Comment: Mirena inserted 07-13-10   Family History  Problem Relation Age of Onset  . Hypertension Mother   . Heart disease Mother     DIED WITH MI AT AGE 31--WAS A SMOKER  . Hypertension Father   . Hypertension Sister   . Multiple sclerosis Brother   . Breast cancer Maternal Grandmother     Age 11    ROS: A comprehensive Review of Systems - was negative  PHYSICAL EXAM BP 136/78  Pulse 65  Ht 5' 6.5" (1.689 m)  Wt 172 lb (78.019  kg)  BMI 27.35 kg/m2 Gen: NAD, resting comfortably on table, appears stated age Neck: no carotid bruit; no LAN or  JVD CV: RRR, no M./R./G., nondisplaced PMI.  Pulm: CTAB, nonlabored, good air movement Ext: no edema Neuro: moves all extremities, grossly intact  EKG: None today  Recent Labs: From PCP note "LDL was 135, otherwise normal"  ASSESSMENT / PLAN:  Abnormal finding on cardiovascular stress test - low risk, but with suggestion of exercise-induced myocardial dysfunction Despite the fact that she did so well, there are some somewhat concerning aspects of the study. The fact that is not completely normal would suggest that there is potential underlying cardiac abnormality. This would be more consistent with either diastolic dysfunction or microvascular ischemia versus structural abnormalities.  Plan: Check 2-D echocardiogram  With this finding, I think we need to take the LDL not quite being at goal more seriously. Meaning that we would want to increase risk factor modification. She's been trying diet and exercise for 2 years without any significant benefit. I will start her on low-dose atorvastatin. We'll recheck labs in about 3 months.  Would also consider OTC L-arginine and co-Q10.  Continue exercise and dietary modification  High cholesterol With this finding on her stress test, and family history of would consider her at high-risk. We are going to shoot for down the LDL below 1:30 but more target of <100.  Plan: Atorvastatin 10 mg daily; recheck labs in 3 months  Family history of early CAD Low risk CPET test, however there was suggestion of exercise induced myocardial dysfunction that would be concerning for possible microvascular ischemia. This could potentially be a precursor to microvascular ischemia. We'll need to continue to monitor and work on respect to modification.   Followup in 1 year, unless echocardiogram is grossly abnormal.   Laikynn Pollio W. Herbie Baltimore, M.D.,  M.S. [facility] [address] [place], [place]  [address]  [phone] Pager # [phone]

## 2013-10-06 NOTE — Assessment & Plan Note (Addendum)
With this finding on her stress test, and family history of would consider her at high-risk. We are going to shoot for down the LDL below 1:30 but more target of <100.  Plan: Atorvastatin 10 mg daily; recheck labs in 3 months

## 2013-10-23 ENCOUNTER — Telehealth: Payer: Self-pay | Admitting: *Deleted

## 2013-10-23 DIAGNOSIS — Z8249 Family history of ischemic heart disease and other diseases of the circulatory system: Secondary | ICD-10-CM

## 2013-10-23 DIAGNOSIS — E782 Mixed hyperlipidemia: Secondary | ICD-10-CM

## 2013-10-23 DIAGNOSIS — R9439 Abnormal result of other cardiovascular function study: Secondary | ICD-10-CM

## 2013-10-23 NOTE — Telephone Encounter (Signed)
Message copied by Tobin Chad on Fri Oct 23, 2013  2:51 PM ------      Message from: Marykay Lex      Created: Thu Oct 22, 2013  2:34 PM       So, I contacted the CPET MD.      He says: These are cases of "early stage" coronary atherosclerosis & are a "warning sign" to increase physical activity -- Exercise Rx & initiate statin therapy.        We should check an NMR panel.  If she really doesn't want to take a statin, she can try aggressive lifestyle modification with Diet & Exercise.  We can then recheck a test in 1 yr to see if the Peak VO2 improves. If it does not improve, would then want to start statin.            We need to reverse the trend in abnormal LV function -- this can reverse if we catch it this early!!.                  Marykay Lex, MD' ------

## 2013-10-23 NOTE — Telephone Encounter (Signed)
Spoke to patient. Result given . Verbalized understanding Patient stated she had an appointment with  Dr Jacinto Reap was started ATORVASTATIN  ABOUT 2 WEEKS AGO. INFORMED patient to continue with Atorvastatin  --will have a NMR PANEL done 2 months -sometime in Feb 2015. Labslip will be mailed to her.

## 2013-10-23 NOTE — Telephone Encounter (Signed)
Message copied by Tobin Chad on Fri Oct 23, 2013  3:34 PM ------      Message from: Marykay Lex      Created: Thu Oct 22, 2013  2:34 PM       So, I contacted the CPET MD.      He says: These are cases of "early stage" coronary atherosclerosis & are a "warning sign" to increase physical activity -- Exercise Rx & initiate statin therapy.        We should check an NMR panel.  If she really doesn't want to take a statin, she can try aggressive lifestyle modification with Diet & Exercise.  We can then recheck a test in 1 yr to see if the Peak VO2 improves. If it does not improve, would then want to start statin.            We need to reverse the trend in abnormal LV function -- this can reverse if we catch it this early!!.                  Marykay Lex, MD' ------

## 2013-10-23 NOTE — Telephone Encounter (Signed)
LEFT MESSAGE TO RETURN CALL ABOUT MET TEST.

## 2013-12-04 ENCOUNTER — Telehealth: Payer: Self-pay | Admitting: *Deleted

## 2013-12-04 DIAGNOSIS — E782 Mixed hyperlipidemia: Secondary | ICD-10-CM

## 2013-12-04 DIAGNOSIS — R9439 Abnormal result of other cardiovascular function study: Secondary | ICD-10-CM

## 2013-12-04 DIAGNOSIS — Z8249 Family history of ischemic heart disease and other diseases of the circulatory system: Secondary | ICD-10-CM

## 2013-12-04 NOTE — Telephone Encounter (Signed)
LETTER MAILED CONCERNING LABS NEEDED FOR FEB 2015

## 2014-02-04 LAB — COMPREHENSIVE METABOLIC PANEL
ALT: 10 U/L (ref 0–35)
AST: 15 U/L (ref 0–37)
Albumin: 3.9 g/dL (ref 3.5–5.2)
Alkaline Phosphatase: 42 U/L (ref 39–117)
BUN: 15 mg/dL (ref 6–23)
CALCIUM: 9.2 mg/dL (ref 8.4–10.5)
CHLORIDE: 102 meq/L (ref 96–112)
CO2: 28 meq/L (ref 19–32)
CREATININE: 0.79 mg/dL (ref 0.50–1.10)
GLUCOSE: 91 mg/dL (ref 70–99)
Potassium: 4.6 mEq/L (ref 3.5–5.3)
Sodium: 137 mEq/L (ref 135–145)
TOTAL PROTEIN: 6.1 g/dL (ref 6.0–8.3)
Total Bilirubin: 0.6 mg/dL (ref 0.2–1.2)

## 2014-02-05 LAB — NMR LIPOPROFILE WITH LIPIDS
Cholesterol, Total: 155 mg/dL (ref <200)
HDL Particle Number: 34.8 umol/L (ref >=30.5)
HDL SIZE: 9.8 nm (ref >=9.2)
HDL-C: 63 mg/dL (ref >=40)
LDL CALC: 82 mg/dL (ref <100)
LDL PARTICLE NUMBER: 995 nmol/L (ref <1000)
LDL Size: 20.5 nm — ABNORMAL LOW (ref >20.5)
LP-IR Score: <25 (ref <=45)
Large HDL-P: 12.9 umol/L (ref >=4.8)
SMALL LDL PARTICLE NUMBER: 402 nmol/L (ref <=527)
TRIGLYCERIDES: 50 mg/dL (ref <150)
VLDL SIZE: 40.5 nm (ref <=46.6)

## 2014-04-13 ENCOUNTER — Other Ambulatory Visit: Payer: Self-pay

## 2014-04-13 MED ORDER — ATORVASTATIN CALCIUM 10 MG PO TABS: 10.0000 mg | ORAL_TABLET | Freq: Every day | ORAL | Status: DC

## 2014-04-13 NOTE — Telephone Encounter (Signed)
Rx was sent to pharmacy electronically. 

## 2014-09-13 ENCOUNTER — Encounter: Payer: Self-pay | Admitting: Cardiology

## 2014-10-14 ENCOUNTER — Other Ambulatory Visit: Payer: Self-pay | Admitting: Cardiology

## 2015-01-29 ENCOUNTER — Other Ambulatory Visit: Payer: Self-pay | Admitting: Cardiology

## 2015-01-31 NOTE — Telephone Encounter (Signed)
Rx(s) sent to pharmacy electronically.  

## 2015-02-26 ENCOUNTER — Other Ambulatory Visit: Payer: Self-pay | Admitting: Cardiology

## 2015-02-28 NOTE — Telephone Encounter (Signed)
Rx(s) sent to pharmacy electronically.  

## 2015-03-11 ENCOUNTER — Other Ambulatory Visit: Payer: Self-pay | Admitting: Cardiology

## 2015-03-11 NOTE — Telephone Encounter (Signed)
Rx(s) sent to pharmacy electronically.  

## 2015-03-22 ENCOUNTER — Other Ambulatory Visit: Payer: Self-pay | Admitting: Cardiology

## 2015-03-22 NOTE — Telephone Encounter (Signed)
Rx(s) sent to pharmacy electronically.  

## 2015-04-20 ENCOUNTER — Other Ambulatory Visit: Payer: Self-pay | Admitting: Cardiology

## 2015-04-20 NOTE — Telephone Encounter (Signed)
Rx(s) sent to pharmacy electronically.  

## 2015-05-12 ENCOUNTER — Encounter: Payer: Self-pay | Admitting: Cardiology

## 2015-05-12 ENCOUNTER — Ambulatory Visit (INDEPENDENT_AMBULATORY_CARE_PROVIDER_SITE_OTHER): Payer: BC Managed Care – PPO | Admitting: Cardiology

## 2015-05-12 DIAGNOSIS — R0609 Other forms of dyspnea: Secondary | ICD-10-CM | POA: Diagnosis not present

## 2015-05-12 DIAGNOSIS — E785 Hyperlipidemia, unspecified: Secondary | ICD-10-CM

## 2015-05-12 DIAGNOSIS — R9439 Abnormal result of other cardiovascular function study: Secondary | ICD-10-CM | POA: Diagnosis not present

## 2015-05-12 DIAGNOSIS — Z79899 Other long term (current) drug therapy: Secondary | ICD-10-CM

## 2015-05-12 LAB — LIPID PANEL
CHOLESTEROL: 160 mg/dL (ref 0–200)
HDL: 75 mg/dL (ref >=46)
LDL Cholesterol: 75 mg/dL (ref 0–99)
Total CHOL/HDL Ratio: 2.1 Ratio
Triglycerides: 52 mg/dL (ref <150)
VLDL: 10 mg/dL (ref 0–40)

## 2015-05-12 LAB — COMPREHENSIVE METABOLIC PANEL
ALBUMIN: 4.4 g/dL (ref 3.5–5.2)
ALK PHOS: 44 U/L (ref 39–117)
ALT: 16 U/L (ref 0–35)
AST: 19 U/L (ref 0–37)
BUN: 13 mg/dL (ref 6–23)
CO2: 25 mEq/L (ref 19–32)
CREATININE: 0.78 mg/dL (ref 0.50–1.10)
Calcium: 9.2 mg/dL (ref 8.4–10.5)
Chloride: 103 mEq/L (ref 96–112)
GLUCOSE: 77 mg/dL (ref 70–99)
Potassium: 4 mEq/L (ref 3.5–5.3)
Sodium: 141 mEq/L (ref 135–145)
TOTAL PROTEIN: 6.8 g/dL (ref 6.0–8.3)
Total Bilirubin: 0.7 mg/dL (ref 0.2–1.2)

## 2015-05-12 MED ORDER — ATORVASTATIN CALCIUM 10 MG PO TABS: ORAL_TABLET | ORAL | Status: DC

## 2015-05-12 NOTE — Progress Notes (Signed)
PCP: Abigail Miyamoto, MD  Clinic Note: Chief Complaint  Patient presents with  . Follow-up     no chest pain, no shortness of breath,no edema, no pain in legs, no cramping in legs, no lightheadedness, no dizziness    HPI: Teresa Christensen is a 54 y.o. female with a PMH below who presents today for annual followup for cardiovascular risk factors -- most notably mother having MI at age 95. I initially saw her in November 2014 just after her 52th birthday for evaluation of hyperlipidemia & decreased exercise tolerance,    Risk stratification with CPET-MET-TEST:  She did fairly well her CPET test as far as exercise tolerance and peak VO2 levels, however she did have divergence of the heart rate/stroke volume curve with increasing work rate and decreasing stroke volume corresponding and brought decreasing cardiac output. This would be consistent with exercise-induced myocardial dysfunction during the last 5 minutes of exercise.   Interestingly, however with excellent anaerobic depression old and peak VO2, she was to consider low risk  Last seen Nov 2014 -- here for f/u & lipid check/ refill of Lipitor  Interval History: She continues to deny any symptoms of chest tightness or pressure with rest or exertion. She has some decreased exercise tolerance as mentioned in the initial note.  The remainder of cardiac review of systems is as follows: Cardiovascular ROS: positive for - mildly decreased exercise tolerance -- notices most with going up stairs (not with playing tennis) negative for - chest pain, dyspnea on exertion, edema, irregular heartbeat, loss of consciousness, murmur, orthopnea, palpitations, paroxysmal nocturnal dyspnea, rapid heart rate or shortness of breath,  No lightheadedness, dizziness, weakness or syncope/near syncope. No TIA or amaurosis fugax in his. No claudication symptoms.  Past Medical History  Diagnosis Date  . CIN III (cervical intraepithelial neoplasia grade III)  with severe dysplasia   . High cholesterol October 2014    Total cholesterol 198, LDL 135, HDL 53   Prior Cardiac Evaluation and Past Surgical History: Past Surgical History  Procedure Laterality Date  . Tonsillectomy    . Laser vaporization of cervix    . Endometrial ablation  11/2003  . Intrauterine device insertion  07/2010    mirena  . Colposcopy    . Cpet-met-test  November 2014    Effort (RER): 1.18 (>target); peak HR 166 bpm (87% max predicted); HR/Stroke Vol curve discordance (last 5.5 min drop in CO with increasing HR) -> exercise-induced myocardial dysfunction; low risk based on high anaerobic threshold and peak VO2    No Known Allergies  Current Outpatient Prescriptions  Medication Sig Dispense Refill  . atomoxetine (STRATTERA) 25 MG capsule Take 25 mg by mouth daily.    Marland Kitchen atorvastatin (LIPITOR) 10 MG tablet TAKE 1 TABLET BY MOUTH DAILY. 30 tablet 0  . buPROPion (WELLBUTRIN XL) 300 MG 24 hr tablet Take 1 tablet by mouth daily. Take 1 tab daily  5  . cetirizine (ZYRTEC) 10 MG tablet Take 10 mg by mouth daily. prn    . Ibuprofen (ADVIL PO) Take by mouth. PRN     . levonorgestrel (MIRENA) 20 MCG/24HR IUD 1 each by Intrauterine route once.      . Multiple Vitamin (MULTIVITAMIN) capsule Take 1 capsule by mouth daily.      Marland Kitchen ZOVIRAX 5 % APPLY TOPICALLY EVERY 3 (THREE) HOURS. 30 g 2   No current facility-administered medications for this visit.   Social History  . Marital Status: Married  Has 2 children  age 70 and 53  Social History Main Topics  . Smoking status: Never Smoker   . Smokeless tobacco: Never Used  . Alcohol Use: 1.0 oz/week    2 drink(s) per week  . Drug Use: No  . Sexual Activity: Yes    Birth Control/ Protection: IUD     Comment: Mirena inserted 07-13-10   Family History  Problem Relation Age of Onset  . Hypertension Mother   . Heart disease Mother     DIED WITH MI AT AGE 1--WAS A SMOKER  . Hypertension Father   . Hypertension Sister   . Multiple  sclerosis Brother   . Breast cancer Maternal Grandmother     Age 41    ROS: A comprehensive Review of Systems - was performed Review of Systems  Constitutional: Positive for weight loss (!2 lb in 18 months).  Cardiovascular: Negative for palpitations and claudication.  Gastrointestinal: Negative for blood in stool and melena.  Genitourinary: Negative for hematuria.  Musculoskeletal: Negative for joint pain.  Neurological: Negative for dizziness.  All other systems reviewed and are negative.  Wt Readings from Last 3 Encounters:  05/12/15 71.85 kg (158 lb 6.4 oz)  10/05/13 78.019 kg (172 lb)  09/15/13 77.111 kg (170 lb)  -- intentional   PHYSICAL EXAM BP 120/88 mmHg  Pulse 78  Ht '5\' 6"'  (1.676 m)  Wt 71.85 kg (158 lb 6.4 oz)  BMI 25.58 kg/m2 Gen: NAD, resting comfortably on table, appears stated age Neck: no carotid bruit; no LAN or  JVD CV: RRR, no M./R./G., Normal S1&S2; nondisplaced PMI.  Abd: soft, NT/ND/NABS, No HSM Pulm: CTAB, nonlabored, good air movement Ext: no edema Neuro: moves all extremities, grossly intact  EKG: None today  Recent Labs: - none available; she did have repeat set of labs in March 2015 really LDL and reduced from 135 to 82, HDL 63, total cholesterol 155 triglycerides stable at 50. 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 Continue to stay active. Recommended continued cardiovascular exercise. Continue low-dose statin plus/minus coenzyme Q10. Would also recommend baby aspirin daily.       Relevant Orders   EKG 12-Lead  (Completed)   Comprehensive metabolic panel (Completed)   Lipid panel (Completed)   Exertional dyspnea (Chronic)    Doing better. Recommend continued risk modification for potential microvascular CAD. This includes increasing exercise level.      Relevant Orders   EKG 12-Lead (Completed)   Comprehensive metabolic panel (Completed)   Lipid panel (Completed)   Hyperlipidemia with target LDL less than 100 (Chronic)    Currently taking Lipitor at low dose. Her target LDL should be less than 100. [facility] Lipids & CMP      Relevant Medications   atorvastatin (LIPITOR) 10 MG tablet   Other Relevant Orders   EKG 12-Lead (Completed)   Comprehensive metabolic panel (Completed)   Lipid panel (Completed)    Other Visit Diagnoses    Drug therapy        Relevant Orders    Comprehensive metabolic  panel (Completed)    Lipid panel (Completed)      PATIENT INSTRUCTIONS: LABS TO BE DRAWN  -CMP LIPIDS   NO OTHER CHANGES WITH MEDICATIONS.  Your physician recommends that you schedule a follow-up appointment in 12 MONTHS WITH AN EXTENDER.    Your physician wants you to follow-up in [place].      Leonie Man, M.D., M.S. Interventional Cardiologist   [address] [place], [place]  [address]  [phone] Pager # [phone]

## 2015-05-12 NOTE — Patient Instructions (Signed)
LABS  -CMP LIPIDS   NO OTHER CHANGES WITH MEDICATIONS.  Your physician recommends that you schedule a follow-up appointment in 12 MONTHS WITH AN EXTENDER.    Your physician wants you to follow-up in 24 MONTHS DR [facility]ARD[facility]. You will receive a reminder letter in the mail two months in advance. If you don't receive a letter, please call our office to schedule the follow-up appointment.

## 2015-05-14 ENCOUNTER — Encounter: Payer: Self-pay | Admitting: Cardiology

## 2015-05-14 NOTE — Assessment & Plan Note (Signed)
Stable now with no active symptoms. Continue to stay active. Recommended continued cardiovascular exercise. Continue low-dose statin plus/minus coenzyme Q10. Would also recommend baby aspirin daily.

## 2015-05-14 NOTE — Assessment & Plan Note (Addendum)
Currently taking Lipitor at low dose. Her target LDL should be less than 100. [facility] Lipids & CMP

## 2015-05-14 NOTE — Assessment & Plan Note (Signed)
Doing better. Recommend continued risk modification for potential microvascular CAD. This includes increasing exercise level.

## 2015-05-18 ENCOUNTER — Other Ambulatory Visit: Payer: Self-pay | Admitting: Cardiology

## 2015-05-18 NOTE — Telephone Encounter (Signed)
Rx(s) sent to pharmacy electronically.  

## 2015-05-24 ENCOUNTER — Encounter: Payer: Self-pay | Admitting: Cardiology

## 2015-06-21 ENCOUNTER — Other Ambulatory Visit: Payer: Self-pay | Admitting: Cardiology

## 2015-06-21 ENCOUNTER — Other Ambulatory Visit: Payer: Self-pay | Admitting: *Deleted

## 2015-06-21 NOTE — Telephone Encounter (Signed)
REFILL 

## 2015-08-10 ENCOUNTER — Encounter: Payer: BC Managed Care – PPO | Admitting: Gynecology

## 2015-08-18 ENCOUNTER — Ambulatory Visit (INDEPENDENT_AMBULATORY_CARE_PROVIDER_SITE_OTHER): Payer: BC Managed Care – PPO | Admitting: Gynecology

## 2015-08-18 ENCOUNTER — Telehealth: Payer: Self-pay | Admitting: *Deleted

## 2015-08-18 ENCOUNTER — Encounter: Payer: Self-pay | Admitting: Gynecology

## 2015-08-18 VITALS — BP 130/90 | Ht 67.0 in | Wt 153.0 lb

## 2015-08-18 DIAGNOSIS — Z23 Encounter for immunization: Secondary | ICD-10-CM

## 2015-08-18 DIAGNOSIS — Z01419 Encounter for gynecological examination (general) (routine) without abnormal findings: Secondary | ICD-10-CM | POA: Diagnosis not present

## 2015-08-18 DIAGNOSIS — N951 Menopausal and female climacteric states: Secondary | ICD-10-CM | POA: Diagnosis not present

## 2015-08-18 DIAGNOSIS — N63 Unspecified lump in unspecified breast: Secondary | ICD-10-CM

## 2015-08-18 NOTE — Patient Instructions (Addendum)
Estradiol skin patches What is this medicine? ESTRADIOL (es tra DYE ole) skin patches contain an estrogen. It is mostly used as hormone replacement in menopausal women. It helps to treat hot flashes and prevent osteoporosis. It is also used to treat women with low estrogen levels or those who have had their ovaries removed. This medicine may be used for other purposes; ask your health care provider or pharmacist if you have questions. What should I tell my health care provider before I take this medicine? They need to know if you have any of these conditions: -abnormal vaginal bleeding -blood vessel disease or blood clots -breast, cervical, endometrial, ovarian, liver, or uterine cancer -dementia -diabetes -gallbladder disease -heart disease or recent heart attack -high blood pressure -high cholesterol -high level of calcium in the blood -hysterectomy -kidney disease -liver disease -migraine headaches -protein C deficiency -protein S deficiency -stroke -systemic lupus erythematosus (SLE) -tobacco smoker -an unusual or allergic reaction to estrogens, other hormones, medicines, foods, dyes, or preservatives -pregnant or trying to get pregnant -breast-feeding How should I use this medicine? This medicine is for external use only. Follow the directions on the prescription label. Tear open the pouch, do not use scissors. Remove the stiff protective liner covering the adhesive. Try not to touch the adhesive. Apply the patch, sticky side to the skin, to an area that is clean, dry and hairless. Avoid injured, irritated, calloused, or scarred areas. Do not apply the skin patches to your breasts or around the waistline. Use a different site each time to prevent skin irritation. Do not cut or trim the patch. Do not stop using except on the advice of your doctor or health care professional. Do not wear more than one patch at a time unless you are told to do so by your doctor or health care  professional. Contact your pediatrician regarding the use of this medicine in children. Special care may be needed. A patient package insert for the product will be given with each prescription and refill. Read this sheet carefully each time. The sheet may change frequently. Overdosage: If you think you have taken too much of this medicine contact a poison control center or emergency room at once. NOTE: This medicine is only for you. Do not share this medicine with others. What if I miss a dose? If you miss a dose, apply it as soon as you can. If it is almost time for your next dose, apply only that dose. Do not apply double or extra doses. What may interact with this medicine? Do not take this medicine with any of the following medications: -aromatase inhibitors like aminoglutethimide, anastrozole, exemestane, letrozole, testolactone This medicine may also interact with the following medications: -carbamazepine -certain antibiotics used to treat infections -certain barbiturates used for inducing sleep or treating seizures -grapefruit juice -medicines for fungus infections like itraconazole and ketoconazole -raloxifene or tamoxifen -rifabutin, rifampin, or rifapentine -ritonavir -St. John's Wort This list may not describe all possible interactions. Give your health care provider a list of all the medicines, herbs, non-prescription drugs, or dietary supplements you use. Also tell them if you smoke, drink alcohol, or use illegal drugs. Some items may interact with your medicine. What should I watch for while using this medicine? Visit your doctor or health care professional for regular checks on your progress. You will need a regular breast and pelvic exam and Pap smear while on this medicine. You should also discuss the need for regular mammograms with your health care professional, and  follow his or her guidelines for these tests. This medicine can make your body retain fluid, making your  fingers, hands, or ankles swell. Your blood pressure can go up. Contact your doctor or health care professional if you feel you are retaining fluid. If you have any reason to think you are pregnant, stop taking this medicine right away and contact your doctor or health care professional. Smoking increases the risk of getting a blood clot or having a stroke while you are taking this medicine, especially if you are more than 52 years old. You are strongly advised not to smoke. If you wear contact lenses and notice visual changes, or if the lenses begin to feel uncomfortable, consult your eye doctor or health care professional. This medicine can increase the risk of developing a condition (endometrial hyperplasia) that may lead to cancer of the lining of the uterus. Taking progestins, another hormone drug, with this medicine lowers the risk of developing this condition. Therefore, if your uterus has not been removed (by a hysterectomy), your doctor may prescribe a progestin for you to take together with your estrogen. You should know, however, that taking estrogens with progestins may have additional health risks. You should discuss the use of estrogens and progestins with your health care professional to determine the benefits and risks for you. If you are going to have surgery or an MRI, you may need to stop taking this medicine. Consult your health care professional for advice before you schedule the surgery. You may bathe or participate in other activities while wearing your patch. If the patch pulls loose or falls off, you may reapply it if the patch is sticky enough to stay on the skin. You should reapply the patch in a different area. Use a fresh patch if it will no longer stick. What side effects may I notice from receiving this medicine? Side effects that you should report to your doctor or health care professional as soon as possible: -allergic reactions like skin rash, itching or hives, swelling of  the face, lips, or tongue -breast tissue changes or discharge -changes in vision -chest pain -confusion, trouble speaking or understanding -dark urine -general ill feeling or flu-like symptoms -light-colored stools -nausea, vomiting -pain, swelling, warmth in the leg -right upper belly pain -severe headaches -shortness of breath -sudden numbness or weakness of the face, arm or leg -trouble walking, dizziness, loss of balance or coordination -unusual vaginal bleeding -yellowing of the eyes or skin Side effects that usually do not require medical attention (report to your doctor or health care professional if they continue or are bothersome): -hair loss -increased hunger or thirst -increased urination -symptoms of vaginal infection like itching, irritation or unusual discharge -unusually weak or tired This list may not describe all possible side effects. Call your doctor for medical advice about side effects. You may report side effects to FDA at 1-800-FDA-1088. Where should I keep my medicine? Keep out of the reach of children. Store at room temperature below 30 degrees C (86 degrees F). Do not store any patches that have been removed from their protective pouch. Throw away any unused medicine after the expiration date. Dispose of used patches properly. Since used patches may still contain active hormones, fold the patch in half so that it sticks to itself prior to disposal. NOTE: This sheet is a summary. It may not cover all possible information. If you have questions about this medicine, talk to your doctor, pharmacist, or health care provider.  2016, [employer]. (2011-01-31 09:19:41) Hormone Therapy At menopause, your body begins making less estrogen and progesterone hormones. This causes the body to stop having menstrual periods. This is because estrogen and progesterone hormones control your periods and menstrual cycle. A lack of estrogen may cause symptoms such  as:  Hot flushes (or hot flashes).  Vaginal dryness.  Dry skin.  Loss of sex drive.  Risk of bone loss (osteoporosis). When this happens, you may choose to take hormone therapy to get back the estrogen lost during menopause. When the hormone estrogen is given alone, it is usually referred to as ET (Estrogen Therapy). When the hormone progestin is combined with estrogen, it is generally called HT (Hormone Therapy). This was formerly known as hormone replacement therapy (HRT). Your caregiver can help you make a decision on what will be best for you. The decision to use HT seems to change often as new studies are done. Many studies do not agree on the benefits of hormone replacement therapy. LIKELY BENEFITS OF HT INCLUDE PROTECTION FROM:  Hot Flushes (also called hot flashes) - A hot flush is a sudden feeling of heat that spreads over the face and body. The skin may redden like a blush. It is connected with sweats and sleep disturbance. Women going through menopause may have hot flushes a few times a month or several times per day depending on the woman.  Osteoporosis (bone loss) - Estrogen helps guard against bone loss. After menopause, a woman's bones slowly lose calcium and become weak and brittle. As a result, bones are more likely to break. The hip, wrist, and spine are affected most often. Hormone therapy can help slow bone loss after menopause. Weight bearing exercise and taking calcium with vitamin D also can help prevent bone loss. There are also medications that your caregiver can prescribe that can help prevent osteoporosis.  Vaginal dryness - Loss of estrogen causes changes in the vagina. Its lining may become thin and dry. These changes can cause pain and bleeding during sexual intercourse. Dryness can also lead to infections. This can cause burning and itching. (Vaginal estrogen treatment can help relieve pain, itching, and dryness.)  Urinary tract infections are more common after  menopause because of lack of estrogen. Some women also develop urinary incontinence because of low estrogen levels in the vagina and bladder.  Possible other benefits of estrogen include a positive effect on mood and short-term memory in women. RISKS AND COMPLICATIONS  Using estrogen alone without progesterone causes the lining of the uterus to grow. This increases the risk of lining of the uterus (endometrial) cancer. Your caregiver should give another hormone called progestin if you have a uterus.  Women who take combined (estrogen and progestin) HT appear to have an increased risk of breast cancer. The risk appears to be small, but increases throughout the time that HT is taken.  Combined therapy also makes the breast tissue slightly denser which makes it harder to read mammograms (breast X-rays).  Combined, estrogen and progesterone therapy can be taken together every day, in which case there may be spotting of blood. HT therapy can be taken cyclically in which case you will have menstrual periods. Cyclically means HT is taken for a set amount of days, then not taken, then this process is repeated.  HT may increase the risk of stroke, heart attack, breast cancer and forming blood clots in your leg.  Transdermal estrogen (estrogen that is absorbed through the skin with a patch or a  cream) may have better results with:  Cholesterol.  Blood pressure.  Blood clots. Having the following conditions may indicate you should not have HT:  Endometrial cancer.  Liver disease.  Breast cancer.  Heart disease.  History of blood clots.  Stroke. TREATMENT   If you choose to take HT and have a uterus, usually estrogen and progestin are prescribed.  Your caregiver will help you decide the best way to take the medications.  Possible ways to take estrogen include:  Pills.  Patches.  Gels.  Sprays.  Vaginal estrogen cream, rings and tablets.  It is best to take the lowest dose  possible that will help your symptoms and take them for the shortest period of time that you can.  Hormone therapy can help relieve some of the problems (symptoms) that affect women at menopause. Before making a decision about HT, talk to your caregiver about what is best for you. Be well informed and comfortable with your decisions. HOME CARE INSTRUCTIONS   Follow your caregivers advice when taking the medications.  A Pap test is done to screen for cervical cancer.  The first Pap test should be done at age 47.  Between ages 60 and 42, Pap tests are repeated every 2 years.  Beginning at age 85, you are advised to have a Pap test every 3 years as long as the past 3 Pap tests have been normal.  Some women have medical problems that increase the chance of getting cervical cancer. Talk to your caregiver about these problems. It is especially important to talk to your caregiver if a new problem develops soon after your last Pap test. In these cases, your caregiver may recommend more frequent screening and Pap tests.  The above recommendations are the same for women who have or have not gotten the vaccine for HPV (human papillomavirus).  If you had a hysterectomy for a problem that was not a cancer or a condition that could lead to cancer, then you no longer need Pap tests. However, even if you no longer need a Pap test, a regular exam is a good idea to make sure no other problems are starting.  If you are between ages 52 and 66, and you have had normal Pap tests going back 10 years, you no longer need Pap tests. However, even if you no longer need a Pap test, a regular exam is a good idea to make sure no other problems are starting.  If you have had past treatment for cervical cancer or a condition that could lead to cancer, you need Pap tests and screening for cancer for at least 20 years after your treatment.  If Pap tests have been discontinued, risk factors (such as a new sexual  partner)need to be re-assessed to determine if screening should be resumed.  Some women may need screenings more often if they are at high risk for cervical cancer.  Get mammograms done as per the advice of your caregiver. SEEK IMMEDIATE MEDICAL CARE IF:  You develop abnormal vaginal bleeding.  You have pain or swelling in your legs, shortness of breath, or chest pain.  You develop dizziness or headaches.  You have lumps or changes in your breasts or armpits.  You have slurred speech.  You develop weakness or numbness of your arms or legs.  You have pain, burning, or bleeding when urinating.  You develop abdominal pain.   This information is not intended to replace advice given to you by your health care  provider. Make sure you discuss any questions you have with your health care provider.   Document Released: 07/28/2003 Document Revised: 03/15/2015 Document Reviewed: 05/02/2015 Elsevier Interactive Patient Education 2016 [place]. Menopause Menopause is the normal time of life when menstrual periods stop completely. Menopause is complete when you have missed 12 consecutive menstrual periods. It usually occurs between the ages of 3 years and 50 years. Very rarely does a woman develop menopause before the age of 72 years. At menopause, your ovaries stop producing the female hormones estrogen and progesterone. This can cause undesirable symptoms and also affect your health. Sometimes the symptoms may occur 4-5 years before the menopause begins. There is no relationship between menopause and:  Oral contraceptives.  Number of children you had.  Race.  The age your menstrual periods started (menarche). Heavy smokers and very thin women may develop menopause earlier in life. CAUSES  The ovaries stop producing the female hormones estrogen and progesterone.  Other causes include:  Surgery to remove both ovaries.  The ovaries stop functioning for no known reason.  Tumors  of the pituitary gland in the brain.  Medical disease that affects the ovaries and hormone production.  Radiation treatment to the abdomen or pelvis.  Chemotherapy that affects the ovaries. SYMPTOMS   Hot flashes.  Night sweats.  Decrease in sex drive.  Vaginal dryness and thinning of the vagina causing painful intercourse.  Dryness of the skin and developing wrinkles.  Headaches.  Tiredness.  Irritability.  Memory problems.  Weight gain.  Bladder infections.  Hair growth of the face and chest.  Infertility. More serious symptoms include:  Loss of bone (osteoporosis) causing breaks (fractures).  Depression.  Hardening and narrowing of the arteries (atherosclerosis) causing heart attacks and strokes. DIAGNOSIS   When the menstrual periods have stopped for 12 straight months.  Physical exam.  Hormone studies of the blood. TREATMENT  There are many treatment choices and nearly as many questions about them. The decisions to treat or not to treat menopausal changes is an individual choice made with your health care provider. Your health care provider can discuss the treatments with you. Together, you can decide which treatment will work best for you. Your treatment choices may include:   Hormone therapy (estrogen and progesterone).  Non-hormonal medicines.  Treating the individual symptoms with medicine (for example antidepressants for depression).  Herbal medicines that may help specific symptoms.  Counseling by a psychiatrist or psychologist.  Group therapy.  Lifestyle changes including:  Eating healthy.  Regular exercise.  Limiting caffeine and alcohol.  Stress management and meditation.  No treatment. HOME CARE INSTRUCTIONS   Take the medicine your health care provider gives you as directed.  Get plenty of sleep and rest.  Exercise regularly.  Eat a diet that contains calcium (good for the bones) and soy products (acts like estrogen  hormone).  Avoid alcoholic beverages.  Do not smoke.  If you have hot flashes, dress in layers.  Take supplements, calcium, and vitamin D to strengthen bones.  You can use over-the-counter lubricants or moisturizers for vaginal dryness.  Group therapy is sometimes very helpful.  Acupuncture may be helpful in some cases. SEEK MEDICAL CARE IF:   You are not sure you are in menopause.  You are having menopausal symptoms and need advice and treatment.  You are still having menstrual periods after age 69 years.  You have pain with intercourse.  Menopause is complete (no menstrual period for 12 months) and you develop vaginal  bleeding.  You need a referral to a specialist (gynecologist, psychiatrist, or psychologist) for treatment. SEEK IMMEDIATE MEDICAL CARE IF:   You have severe depression.  You have excessive vaginal bleeding.  You fell and think you have a broken bone.  You have pain when you urinate.  You develop leg or chest pain.  You have a fast pounding heart beat (palpitations).  You have severe headaches.  You develop vision problems.  You feel a lump in your breast.  You have abdominal pain or severe indigestion.   This information is not intended to replace advice given to you by your health care provider. Make sure you discuss any questions you have with your health care provider.   Document Released: 01/19/2004 Document Revised: 07/01/2013 Document Reviewed: 05/28/2013 Elsevier Interactive Patient Education [employer] Colonoscopy A colonoscopy is an exam to look at the entire large intestine (colon). This exam can help find problems such as tumors, polyps, inflammation, and areas of bleeding. The exam takes about 1 hour.  LET [facility] CARE PROVIDER KNOW ABOUT:   Any allergies you have.  All medicines you are taking, including vitamins, herbs, eye drops, creams, and over-the-counter medicines.  Previous problems you or members of your  family have had with the use of anesthetics.  Any blood disorders you have.  Previous surgeries you have had.  Medical conditions you have. RISKS AND COMPLICATIONS  Generally, this is a safe procedure. However, as with any procedure, complications can occur. Possible complications include:  Bleeding.  Tearing or rupture of the colon wall.  Reaction to medicines given during the exam.  Infection (rare). BEFORE THE PROCEDURE   Ask your health care provider about changing or stopping your regular medicines.  You may be prescribed an oral bowel prep. This involves drinking a large amount of medicated liquid, starting the day before your procedure. The liquid will cause you to have multiple loose stools until your stool is almost clear or light green. This cleans out your colon in preparation for the procedure.  Do not eat or drink anything else once you have started the bowel prep, unless your health care provider tells you it is safe to do so.  Arrange for someone to drive you home after the procedure. PROCEDURE   You will be given medicine to help you relax (sedative).  You will lie on your side with your knees bent.  A long, flexible tube with a light and camera on the end (colonoscope) will be inserted through the rectum and into the colon. The camera sends video back to a computer screen as it moves through the colon. The colonoscope also releases carbon dioxide gas to inflate the colon. This helps your health care provider see the area better.  During the exam, your health care provider may take a small tissue sample (biopsy) to be examined under a microscope if any abnormalities are found.  The exam is finished when the entire colon has been viewed. AFTER THE PROCEDURE   Do not drive for 24 hours after the exam.  You may have a small amount of blood in your stool.  You may pass moderate amounts of gas and have mild abdominal cramping or bloating. This is caused by the  gas used to inflate your colon during the exam.  Ask when your test results will be ready and how you will get your results. Make sure you get your test results.   This information is not intended to replace advice  given to you by your health care provider. Make sure you discuss any questions you have with your health care provider.   Document Released: 10/26/2000 Document Revised: 08/19/2013 Document Reviewed: 07/06/2013 Elsevier Interactive Patient Education [employer]

## 2015-08-18 NOTE — Telephone Encounter (Signed)
Orders placed they will contact pt to schedule. 

## 2015-08-18 NOTE — Telephone Encounter (Signed)
-----   Message from Ok Edwards, MD sent at 08/18/2015  9:16 AM EDT -----  Please schedule diagnostic mammogram of left breast and screening mammogram on the right for this patient whose last mammogram last year demonstrated following:  Stable 5 x 7 x 9 mm well defined ovoid homogeneously hypoechoic solid mass at the 3 o'clock position of the left breast 3 cm from the nipple. 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

## 2015-08-18 NOTE — Progress Notes (Signed)
Teresa Christensen [birth year] [id number]   History:    54 y.o.  for annual gyn exam with complaints of hot flashes irritability and mood swing. Patient has a Mirena IUD that was placed 5 years ago. Patient reports no bleeding. She has not had her colonoscopy yet. Her cardiologist is treating her for hyperlipidemia. Patient with family history and her mother at the age of 46 having had an MI. 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 In 2009 she had a Pap smear which showed ascus. It was repeated and still showed ascus. High risk HPV was not detected. Colposcopy with biopsy was negative. All her other Pap smears after laser of the cervix before and after 2009 were normal. Patient's mammogram and ultrasound last year demonstrated the following:  Ultrasound is performed, showing a circumscribed ovoid  homogeneously hypoechoic solid mass with increased through  transmission at the 3 o'clock position of the left breast 3 cm from  the nipple. 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   IMPRESSION:  Stable 5 x 7 x 9 mm well defined ovoid homogeneously hypoechoic  solid mass at the 3 o'clock position of the left breast 3 cm from  the nipple. 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  Gynecologic History No LMP recorded. Patient is not currently having periods (Reason: IUD). Contraception: IUD Last Pap: 2014 and. Results were: normal Last mammogram: 2014. Results were: See above  Obstetric History OB History  Gravida Para Term Preterm AB SAB TAB Ectopic Multiple Living  # Outcome Date GA Lbr Len/2nd Weight Sex Delivery Anes PTL Lv  3 AB           2 Para           1 Para                ROS: A ROS was  performed and pertinent positives and negatives are included in the history.  GENERAL: No fevers or chills. HEENT: No change in vision, no earache, sore throat or sinus congestion. NECK: No pain or stiffness. CARDIOVASCULAR: No chest pain or pressure. No palpitations. PULMONARY: No shortness of breath, cough or wheeze. GASTROINTESTINAL: No abdominal pain, nausea, vomiting or diarrhea, melena or bright red blood per rectum. GENITOURINARY: No urinary frequency, urgency, hesitancy or dysuria. MUSCULOSKELETAL: No joint or muscle pain, no back pain, no recent trauma. DERMATOLOGIC: No rash, no itching, no lesions. ENDOCRINE: No polyuria, polydipsia, no heat or cold intolerance. No recent change in weight. HEMATOLOGICAL: No anemia or easy bruising or bleeding. NEUROLOGIC: No headache, seizures, numbness, tingling or weakness. PSYCHIATRIC: No depression, no loss of interest in normal activity or change in sleep pattern.     Exam: chaperone present  BP 130/90 mmHg  Ht  (1.702 m)  Wt 153 lb (69.4 kg)  BMI 23.96 kg/m2  Body mass index is 23.96 kg/(m^2).  General appearance : Well developed well nourished female. No acute distress HEENT: Eyes: no retinal hemorrhage or exudates,  Neck supple, trachea midline, no carotid bruits, no thyroidmegaly Lungs: Clear to auscultation, no rhonchi or wheezes, or rib retractions  Heart: Regular rate and rhythm, no murmurs  or gallops Breast:Examined in sitting and supine position were symmetrical in appearance, no palpable masses or tenderness,  no skin retraction, no nipple inversion, no nipple discharge, no skin discoloration, no axillary or supraclavicular lymphadenopathy Abdomen: no palpable masses or tenderness, no rebound or guarding Extremities: no edema or skin discoloration or tenderness  Pelvic:  Bartholin, Urethra, Skene Glands: Within normal limits             Vagina: No gross lesions or discharge  Cervix: No gross lesions or discharge, IUD string not  seen  Uterus  anteverted, normal size, shape and consistency, non-tender and mobile  Adnexa  Without masses or tenderness  Anus and perineum  normal   Rectovaginal  normal sphincter tone without palpated masses or tenderness             Hemoccult cards provided     Assessment/Plan:  54 y.o. female for annual exam patient appears to be perimenopausal/menopausal we are going to check her [facility] today. Patient will return back to the office after her mammogram. If her mammogram is normal we will change her Mirena IUD for a new one and start her on low dose estrogen if her [facility] is in the menopausal range and her mammogram is fine. We discussed importance of calcium vitamin D and regular exercise for osteoporosis prevention. We will schedule her bone density study for next year. Patient was provided with the name of gastric neurologist to schedule her colonoscopy. Pap smear not done this year according to the guidelines. Patient received the flu vaccine.    Ok Edwards MD, 9:17 AM 08/18/2015

## 2015-08-18 NOTE — Addendum Note (Signed)
Addended by: Berna Spare A on: 08/18/2015 11:37 AM   Modules accepted: Orders, SmartSet

## 2015-08-19 ENCOUNTER — Telehealth: Payer: Self-pay | Admitting: Gynecology

## 2015-08-19 NOTE — Telephone Encounter (Signed)
08/19/15-Pt was informed today that her [facility] ins will cover the removal of existing Mirena and insertion of new for contraception under her $39.00 copay/wl

## 2015-08-25 NOTE — Telephone Encounter (Signed)
Appointment 09/01/15 @ 10:40am, left message for pt to call.

## 2015-08-25 NOTE — Telephone Encounter (Signed)
Pt aware with the below note. 

## 2015-09-01 ENCOUNTER — Ambulatory Visit
Admission: RE | Admit: 2015-09-01 | Discharge: 2015-09-01 | Disposition: A | Discharge status: Home / Self Care | Payer: BC Managed Care – PPO | Source: Ambulatory Visit | Attending: Gynecology | Admitting: Gynecology

## 2015-09-01 DIAGNOSIS — N63 Unspecified lump in unspecified breast: Secondary | ICD-10-CM

## 2015-09-13 ENCOUNTER — Ambulatory Visit (INDEPENDENT_AMBULATORY_CARE_PROVIDER_SITE_OTHER): Payer: BC Managed Care – PPO | Admitting: Gynecology

## 2015-09-13 ENCOUNTER — Encounter: Payer: Self-pay | Admitting: Gynecology

## 2015-09-13 VITALS — BP 120/72

## 2015-09-13 DIAGNOSIS — N951 Menopausal and female climacteric states: Secondary | ICD-10-CM

## 2015-09-13 NOTE — Patient Instructions (Signed)
Follicle-Stimulating Hormone Follicle-stimulating hormone [facility]) is a hormone made by a gland underneath your brain (pituitary gland). FSH plays an important role in your ability to get pregnant or reproduce (fertility). In women, FSH helps regulate egg development and the menstrual cycle. In men, it helps control sperm production. The level of FSH in your body can change at different times. It varies as you develop and get older. It also changes over the course of a woman's menstrual cycle. Knowing your [facility] level can help your health care provider diagnose the conditions that may be making it hard for you or your partner to have a baby. You may also have this test to help your health care provider diagnose what is causing:  Inability to become pregnant (infertility).  Pituitary gland problems.  Problems with the part of your brain that produces hormones (hypothalamus).  Irregular or loss of menstrual periods.  Low testosterone in men.  Early or late sexual development (puberty) in boys or girls. The [facility] test can also help determine when menopause will start or confirm that you are in menopause. FSH works with several other hormones. You may have an [facility] test along with other hormone tests, such as:  Luteinizing hormone (LH).  Estrogen.  Testosterone. This test requires a blood sample taken from a vein in your arm or hand.  RESULTS It is your responsibility to obtain your test results. Ask the lab or department performing the test when and how you will get your results. Talk to your health care provider if you have any questions about your test results. The results of an [facility] test will be a range of values. Range of Normal Values The normal ranges for adults are:  Female: 1.42-15.4 IU/L.  Female:  Follicular phase: 1.37-9.9 IU/L.  Ovulatory phase: 6.17-17.2 IU/L.  Luteal phase: 1.09-9.2 IU/L.  Postmenopause: 19.3-100.6 IU/L. The normal ranges for children (age 17-10 years)  are:  Female: 0.3-4.6 IU/L.  Female: 0.68-6.7 IU/L. Meaning of Results Outside Normal Value Ranges An FSH level that is lower than normal can indicate:  Disorder of the pituitary gland.  Disorder of the hypothalamus.  Malnutrition. An FSH level that is higher than normal can indicate:  Menopause.  Damage or disease of the ovaries in women.  Damage or disease of the testicles in men.  Early puberty. Discuss your test results with your health care provider. To make a diagnosis, your health care provider will consider your [facility] test results along with the results of other hormone tests.    This information is not intended to replace advice given to you by your health care provider. Make sure you discuss any questions you have with your health care provider.   Document Released: 11/23/2004 Document Revised: 11/19/2014 Document Reviewed: 02/18/2014 Elsevier Interactive Patient Education 2016 Elsevier Inc. Hormone Therapy At menopause, your body begins making less estrogen and progesterone hormones. This causes the body to stop having menstrual periods. This is because estrogen and progesterone hormones control your periods and menstrual cycle. A lack of estrogen may cause symptoms such as:  Hot flushes (or hot flashes).  Vaginal dryness.  Dry skin.  Loss of sex drive.  Risk of bone loss (osteoporosis). When this happens, you may choose to take hormone therapy to get back the estrogen lost during menopause. When the hormone estrogen is given alone, it is usually referred to as ET (Estrogen Therapy). When the hormone progestin is combined with estrogen, it is generally called HT (Hormone Therapy). This was formerly known  as hormone replacement therapy (HRT). Your caregiver can help you make a decision on what will be best for you. The decision to use HT seems to change often as new studies are done. Many studies do not agree on the benefits of hormone replacement therapy. LIKELY  BENEFITS OF HT INCLUDE PROTECTION FROM:  Hot Flushes (also called hot flashes) - A hot flush is a sudden feeling of heat that spreads over the face and body. The skin may redden like a blush. It is connected with sweats and sleep disturbance. Women going through menopause may have hot flushes a few times a month or several times per day depending on the woman.  Osteoporosis (bone loss) - Estrogen helps guard against bone loss. After menopause, a woman's bones slowly lose calcium and become weak and brittle. As a result, bones are more likely to break. The hip, wrist, and spine are affected most often. Hormone therapy can help slow bone loss after menopause. Weight bearing exercise and taking calcium with vitamin D also can help prevent bone loss. There are also medications that your caregiver can prescribe that can help prevent osteoporosis.  Vaginal dryness - Loss of estrogen causes changes in the vagina. Its lining may become thin and dry. These changes can cause pain and bleeding during sexual intercourse. Dryness can also lead to infections. This can cause burning and itching. (Vaginal estrogen treatment can help relieve pain, itching, and dryness.)  Urinary tract infections are more common after menopause because of lack of estrogen. Some women also develop urinary incontinence because of low estrogen levels in the vagina and bladder.  Possible other benefits of estrogen include a positive effect on mood and short-term memory in women. RISKS AND COMPLICATIONS  Using estrogen alone without progesterone causes the lining of the uterus to grow. This increases the risk of lining of the uterus (endometrial) cancer. Your caregiver should give another hormone called progestin if you have a uterus.  Women who take combined (estrogen and progestin) HT appear to have an increased risk of breast cancer. The risk appears to be small, but increases throughout the time that HT is taken.  Combined therapy  also makes the breast tissue slightly denser which makes it harder to read mammograms (breast X-rays).  Combined, estrogen and progesterone therapy can be taken together every day, in which case there may be spotting of blood. HT therapy can be taken cyclically in which case you will have menstrual periods. Cyclically means HT is taken for a set amount of days, then not taken, then this process is repeated.  HT may increase the risk of stroke, heart attack, breast cancer and forming blood clots in your leg.  Transdermal estrogen (estrogen that is absorbed through the skin with a patch or a cream) may have better results with:  Cholesterol.  Blood pressure.  Blood clots. Having the following conditions may indicate you should not have HT:  Endometrial cancer.  Liver disease.  Breast cancer.  Heart disease.  History of blood clots.  Stroke. TREATMENT   If you choose to take HT and have a uterus, usually estrogen and progestin are prescribed.  Your caregiver will help you decide the best way to take the medications.  Possible ways to take estrogen include:  Pills.  Patches.  Gels.  Sprays.  Vaginal estrogen cream, rings and tablets.  It is best to take the lowest dose possible that will help your symptoms and take them for the shortest period of time that you  can.  Hormone therapy can help relieve some of the problems (symptoms) that affect women at menopause. Before making a decision about HT, talk to your caregiver about what is best for you. Be well informed and comfortable with your decisions. HOME CARE INSTRUCTIONS   Follow your caregivers advice when taking the medications.  A Pap test is done to screen for cervical cancer.  The first Pap test should be done at age 26.  Between ages 45 and 66, Pap tests are repeated every 2 years.  Beginning at age 43, you are advised to have a Pap test every 3 years as long as the past 3 Pap tests have been normal.  Some  women have medical problems that increase the chance of getting cervical cancer. Talk to your caregiver about these problems. It is especially important to talk to your caregiver if a new problem develops soon after your last Pap test. In these cases, your caregiver may recommend more frequent screening and Pap tests.  The above recommendations are the same for women who have or have not gotten the vaccine for HPV (human papillomavirus).  If you had a hysterectomy for a problem that was not a cancer or a condition that could lead to cancer, then you no longer need Pap tests. However, even if you no longer need a Pap test, a regular exam is a good idea to make sure no other problems are starting.  If you are between ages 53 and 21, and you have had normal Pap tests going back 10 years, you no longer need Pap tests. However, even if you no longer need a Pap test, a regular exam is a good idea to make sure no other problems are starting.  If you have had past treatment for cervical cancer or a condition that could lead to cancer, you need Pap tests and screening for cancer for at least 20 years after your treatment.  If Pap tests have been discontinued, risk factors (such as a new sexual partner)need to be re-assessed to determine if screening should be resumed.  Some women may need screenings more often if they are at high risk for cervical cancer.  Get mammograms done as per the advice of your caregiver. SEEK IMMEDIATE MEDICAL CARE IF:  You develop abnormal vaginal bleeding.  You have pain or swelling in your legs, shortness of breath, or chest pain.  You develop dizziness or headaches.  You have lumps or changes in your breasts or armpits.  You have slurred speech.  You develop weakness or numbness of your arms or legs.  You have pain, burning, or bleeding when urinating.  You develop abdominal pain.   This information is not intended to replace advice given to you by your health  care provider. Make sure you discuss any questions you have with your health care provider.   Document Released: 07/28/2003 Document Revised: 03/15/2015 Document Reviewed: 05/02/2015 Elsevier Interactive Patient Education 2016 Elsevier Inc. Menopause Menopause is the normal time of life when menstrual periods stop completely. Menopause is complete when you have missed 12 consecutive menstrual periods. It usually occurs between the ages of 48 years and 55 years. Very rarely does a woman develop menopause before the age of 40 years. At menopause, your ovaries stop producing the female hormones estrogen and progesterone. This can cause undesirable symptoms and also affect your health. Sometimes the symptoms may occur 4-5 years before the menopause begins. There is no relationship between menopause and:  Oral contraceptives.  Number of children you had.  Race.  The age your menstrual periods started (menarche). Heavy smokers and very thin women may develop menopause earlier in life. CAUSES  The ovaries stop producing the female hormones estrogen and progesterone.  Other causes include:  Surgery to remove both ovaries.  The ovaries stop functioning for no known reason.  Tumors of the pituitary gland in the brain.  Medical disease that affects the ovaries and hormone production.  Radiation treatment to the abdomen or pelvis.  Chemotherapy that affects the ovaries. SYMPTOMS   Hot flashes.  Night sweats.  Decrease in sex drive.  Vaginal dryness and thinning of the vagina causing painful intercourse.  Dryness of the skin and developing wrinkles.  Headaches.  Tiredness.  Irritability.  Memory problems.  Weight gain.  Bladder infections.  Hair growth of the face and chest.  Infertility. More serious symptoms include:  Loss of bone (osteoporosis) causing breaks (fractures).  Depression.  Hardening and narrowing of the arteries (atherosclerosis) causing heart  attacks and strokes. DIAGNOSIS   When the menstrual periods have stopped for 12 straight months.  Physical exam.  Hormone studies of the blood. TREATMENT  There are many treatment choices and nearly as many questions about them. The decisions to treat or not to treat menopausal changes is an individual choice made with your health care provider. Your health care provider can discuss the treatments with you. Together, you can decide which treatment will work best for you. Your treatment choices may include:   Hormone therapy (estrogen and progesterone).  Non-hormonal medicines.  Treating the individual symptoms with medicine (for example antidepressants for depression).  Herbal medicines that may help specific symptoms.  Counseling by a psychiatrist or psychologist.  Group therapy.  Lifestyle changes including:  Eating healthy.  Regular exercise.  Limiting caffeine and alcohol.  Stress management and meditation.  No treatment. HOME CARE INSTRUCTIONS   Take the medicine your health care provider gives you as directed.  Get plenty of sleep and rest.  Exercise regularly.  Eat a diet that contains calcium (good for the bones) and soy products (acts like estrogen hormone).  Avoid alcoholic beverages.  Do not smoke.  If you have hot flashes, dress in layers.  Take supplements, calcium, and vitamin D to strengthen bones.  You can use over-the-counter lubricants or moisturizers for vaginal dryness.  Group therapy is sometimes very helpful.  Acupuncture may be helpful in some cases. SEEK MEDICAL CARE IF:   You are not sure you are in menopause.  You are having menopausal symptoms and need advice and treatment.  You are still having menstrual periods after age 52 years.  You have pain with intercourse.  Menopause is complete (no menstrual period for 12 months) and you develop vaginal bleeding.  You need a referral to a specialist (gynecologist, psychiatrist,  or psychologist) for treatment. SEEK IMMEDIATE MEDICAL CARE IF:   You have severe depression.  You have excessive vaginal bleeding.  You fell and think you have a broken bone.  You have pain when you urinate.  You develop leg or chest pain.  You have a fast pounding heart beat (palpitations).  You have severe headaches.  You develop vision problems.  You feel a lump in your breast.  You have abdominal pain or severe indigestion.   This information is not intended to replace advice given to you by your health care provider. Make sure you discuss any questions you have with your health care provider.   Document  Released: 01/19/2004 Document Revised: 07/01/2013 Document Reviewed: 05/28/2013 Elsevier Interactive Patient Education [employer]

## 2015-09-13 NOTE — Progress Notes (Signed)
   Patient is a 54 year old was seen in the office early this month for her annual exam. Patient has had a Mirena IUD which is scheduled to be removed sometime this year. She's had no menstrual cycle. She has occasional vasomotor symptoms and hot flashes. She is currently on Wellbutrin XL 300 mg daily. Patient was scheduled to remove her Mirena IUD today with reinsertion of a new one and starting her on a low dose transdermal estrogen twice a week but we will point wait for the results of her mammogram. The following was the interpretation of recent mammogram by radiologist:  FINDINGS: Magnification CC and true lateral views demonstrate a 3 mm group of coarse calcifications within the central aspect of the right breast. No additional concerning masses, calcifications or architectural distortion identified within either breast.  Mammographic images were processed with CAD.  IMPRESSION: Probably benign calcifications within the right breast, likely dystrophic in etiology.  RECOMMENDATION: Right breast diagnostic mammography with magnification views in 6 months to demonstrate stability of right breast calcifications.  I have recommended that we wait for 6 months before initiating estrogen replacement therapy. We will wait for the follow-up mammogram diagnostic along with ultrasound as recommended by the radiologist. Meanwhile she continues peppermint oral to apply transdermally to 3 times a day as needed. I have also given her information onRElizen which is a natural supplement that helps with vasomotor symptoms. She will return back to the office after her mammogram to rediscuss initiating further treatment if needed. Meanwhile we will get a baseline FSH today.  Greater than 50% of time was spent time with patient discussing the menopause and treatment options.

## 2015-09-14 LAB — FOLLICLE STIMULATING HORMONE: FSH: 13.6 m[IU]/mL

## 2015-09-23 ENCOUNTER — Telehealth: Payer: Self-pay

## 2015-09-23 NOTE — Telephone Encounter (Signed)
I prepared letter for patient to send to ins co. Mailed it to patient. Copy is in chart.

## 2015-09-23 NOTE — Telephone Encounter (Signed)
Patient called stating Dr. Glenetta HewjF ordered 3-d mammo for her but now ins co is refusing to pay. She requested a letter be sent to her to send to her ins co supporting the need for this mammo.

## 2016-02-08 ENCOUNTER — Other Ambulatory Visit: Payer: Self-pay | Admitting: Gynecology

## 2016-02-08 DIAGNOSIS — R921 Mammographic calcification found on diagnostic imaging of breast: Secondary | ICD-10-CM

## 2016-03-15 ENCOUNTER — Ambulatory Visit
Admission: RE | Admit: 2016-03-15 | Discharge: 2016-03-15 | Disposition: A | Discharge status: Home / Self Care | Payer: BC Managed Care – PPO | Source: Ambulatory Visit | Attending: Gynecology | Admitting: Gynecology

## 2016-03-15 ENCOUNTER — Other Ambulatory Visit: Payer: Self-pay | Admitting: Gynecology

## 2016-03-15 DIAGNOSIS — N6310 Unspecified lump in the right breast, unspecified quadrant: Secondary | ICD-10-CM

## 2016-03-15 DIAGNOSIS — R921 Mammographic calcification found on diagnostic imaging of breast: Secondary | ICD-10-CM

## 2016-04-12 ENCOUNTER — Ambulatory Visit (INDEPENDENT_AMBULATORY_CARE_PROVIDER_SITE_OTHER): Payer: BC Managed Care – PPO | Admitting: Gynecology

## 2016-04-12 ENCOUNTER — Telehealth: Payer: Self-pay | Admitting: Gynecology

## 2016-04-12 ENCOUNTER — Telehealth: Payer: Self-pay | Admitting: *Deleted

## 2016-04-12 ENCOUNTER — Encounter: Payer: Self-pay | Admitting: Gynecology

## 2016-04-12 VITALS — BP 126/80 | Ht 67.0 in | Wt 153.0 lb

## 2016-04-12 DIAGNOSIS — Z30433 Encounter for removal and reinsertion of intrauterine contraceptive device: Secondary | ICD-10-CM

## 2016-04-12 DIAGNOSIS — N951 Menopausal and female climacteric states: Secondary | ICD-10-CM | POA: Diagnosis not present

## 2016-04-12 DIAGNOSIS — Z975 Presence of (intrauterine) contraceptive device: Secondary | ICD-10-CM | POA: Insufficient documentation

## 2016-04-12 HISTORY — PX: INTRAUTERINE DEVICE INSERTION: SHX323

## 2016-04-12 LAB — FOLLICLE STIMULATING HORMONE: FSH: 85.7 m[IU]/mL

## 2016-04-12 NOTE — Telephone Encounter (Signed)
04/12/16-[facility] will cover the MIrena replacement for contraception under pt $40 copay. Per Ivy@[employer] [id number]

## 2016-04-12 NOTE — Patient Instructions (Signed)
Levonorgestrel intrauterine device (IUD) What is this medicine? LEVONORGESTREL IUD (LEE voe nor jes trel) is a contraceptive (birth control) device. The device is placed inside the uterus by a healthcare professional. It is used to prevent pregnancy and can also be used to treat heavy bleeding that occurs during your period. Depending on the device, it can be used for 3 to 5 years. This medicine may be used for other purposes; ask your health care provider or pharmacist if you have questions. What should I tell my health care provider before I take this medicine? They need to know if you have any of these conditions: -abnormal Pap smear -cancer of the breast, uterus, or cervix -diabetes -endometritis -genital or pelvic infection now or in the past -have more than one sexual partner or your partner has more than one partner -heart disease -history of an ectopic or tubal pregnancy -immune system problems -IUD in place -liver disease or tumor -problems with blood clots or take blood-thinners -use intravenous drugs -uterus of unusual shape -vaginal bleeding that has not been explained -an unusual or allergic reaction to levonorgestrel, other hormones, silicone, or polyethylene, medicines, foods, dyes, or preservatives -pregnant or trying to get pregnant -breast-feeding How should I use this medicine? This device is placed inside the uterus by a health care professional. Talk to your pediatrician regarding the use of this medicine in children. Special care may be needed. Overdosage: If you think you have taken too much of this medicine contact a poison control center or emergency room at once. NOTE: This medicine is only for you. Do not share this medicine with others. What if I miss a dose? This does not apply. What may interact with this medicine? Do not take this medicine with any of the following medications: -amprenavir -bosentan -fosamprenavir This medicine may also interact with  the following medications: -aprepitant -barbiturate medicines for inducing sleep or treating seizures -bexarotene -griseofulvin -medicines to treat seizures like carbamazepine, ethotoin, felbamate, oxcarbazepine, phenytoin, topiramate -modafinil -pioglitazone -rifabutin -rifampin -rifapentine -some medicines to treat HIV infection like atazanavir, indinavir, lopinavir, nelfinavir, tipranavir, ritonavir -St. John's wort -warfarin This list may not describe all possible interactions. Give your health care provider a list of all the medicines, herbs, non-prescription drugs, or dietary supplements you use. Also tell them if you smoke, drink alcohol, or use illegal drugs. Some items may interact with your medicine. What should I watch for while using this medicine? Visit your doctor or health care professional for regular check ups. See your doctor if you or your partner has sexual contact with others, becomes HIV positive, or gets a sexual transmitted disease. This product does not protect you against HIV infection (AIDS) or other sexually transmitted diseases. You can check the placement of the IUD yourself by reaching up to the top of your vagina with clean fingers to feel the threads. Do not pull on the threads. It is a good habit to check placement after each menstrual period. Call your doctor right away if you feel more of the IUD than just the threads or if you cannot feel the threads at all. The IUD may come out by itself. You may become pregnant if the device comes out. If you notice that the IUD has come out use a backup birth control method like condoms and call your health care provider. Using tampons will not change the position of the IUD and are okay to use during your period. What side effects may I notice from receiving this medicine?   Side effects that you should report to your doctor or health care professional as soon as possible: -allergic reactions like skin rash, itching or  hives, swelling of the face, lips, or tongue -fever, flu-like symptoms -genital sores -high blood pressure -no menstrual period for 6 weeks during use -pain, swelling, warmth in the leg -pelvic pain or tenderness -severe or sudden headache -signs of pregnancy -stomach cramping -sudden shortness of breath -trouble with balance, talking, or walking -unusual vaginal bleeding, discharge -yellowing of the eyes or skin Side effects that usually do not require medical attention (report to your doctor or health care professional if they continue or are bothersome): -acne -breast pain -change in sex drive or performance -changes in weight -cramping, dizziness, or faintness while the device is being inserted -headache -irregular menstrual bleeding within first 3 to 6 months of use -nausea This list may not describe all possible side effects. Call your doctor for medical advice about side effects. You may report side effects to FDA at 1-800-FDA-1088. Where should I keep my medicine? This does not apply. NOTE: This sheet is a summary. It may not cover all possible information. If you have questions about this medicine, talk to your doctor, pharmacist, or health care provider.    2016, Elsevier/Gold Standard. (2011-11-29 13:54:04) Hormone Therapy At menopause, your body begins making less estrogen and progesterone hormones. This causes the body to stop having menstrual periods. This is because estrogen and progesterone hormones control your periods and menstrual cycle. A lack of estrogen may cause symptoms such as:  Hot flushes (or hot flashes).  Vaginal dryness.  Dry skin.  Loss of sex drive.  Risk of bone loss (osteoporosis). When this happens, you may choose to take hormone therapy to get back the estrogen lost during menopause. When the hormone estrogen is given alone, it is usually referred to as ET (Estrogen Therapy). When the hormone progestin is combined with estrogen, it is  generally called HT (Hormone Therapy). This was formerly known as hormone replacement therapy (HRT). Your caregiver can help you make a decision on what will be best for you. The decision to use HT seems to change often as new studies are done. Many studies do not agree on the benefits of hormone replacement therapy. LIKELY BENEFITS OF HT INCLUDE PROTECTION FROM:  Hot Flushes (also called hot flashes) - A hot flush is a sudden feeling of heat that spreads over the face and body. The skin may redden like a blush. It is connected with sweats and sleep disturbance. Women going through menopause may have hot flushes a few times a month or several times per day depending on the woman.  Osteoporosis (bone loss) - Estrogen helps guard against bone loss. After menopause, a woman's bones slowly lose calcium and become weak and brittle. As a result, bones are more likely to break. The hip, wrist, and spine are affected most often. Hormone therapy can help slow bone loss after menopause. Weight bearing exercise and taking calcium with vitamin D also can help prevent bone loss. There are also medications that your caregiver can prescribe that can help prevent osteoporosis.  Vaginal dryness - Loss of estrogen causes changes in the vagina. Its lining may become thin and dry. These changes can cause pain and bleeding during sexual intercourse. Dryness can also lead to infections. This can cause burning and itching. (Vaginal estrogen treatment can help relieve pain, itching, and dryness.)  Urinary tract infections are more common after menopause because of lack of estrogen.  Some women also develop urinary incontinence because of low estrogen levels in the vagina and bladder.  Possible other benefits of estrogen include a positive effect on mood and short-term memory in women. RISKS AND COMPLICATIONS  Using estrogen alone without progesterone causes the lining of the uterus to grow. This increases the risk of lining of  the uterus (endometrial) cancer. Your caregiver should give another hormone called progestin if you have a uterus.  Women who take combined (estrogen and progestin) HT appear to have an increased risk of breast cancer. The risk appears to be small, but increases throughout the time that HT is taken.  Combined therapy also makes the breast tissue slightly denser which makes it harder to read mammograms (breast X-rays).  Combined, estrogen and progesterone therapy can be taken together every day, in which case there may be spotting of blood. HT therapy can be taken cyclically in which case you will have menstrual periods. Cyclically means HT is taken for a set amount of days, then not taken, then this process is repeated.  HT may increase the risk of stroke, heart attack, breast cancer and forming blood clots in your leg.  Transdermal estrogen (estrogen that is absorbed through the skin with a patch or a cream) may have better results with:  Cholesterol.  Blood pressure.  Blood clots. Having the following conditions may indicate you should not have HT:  Endometrial cancer.  Liver disease.  Breast cancer.  Heart disease.  History of blood clots.  Stroke. TREATMENT   If you choose to take HT and have a uterus, usually estrogen and progestin are prescribed.  Your caregiver will help you decide the best way to take the medications.  Possible ways to take estrogen include:  Pills.  Patches.  Gels.  Sprays.  Vaginal estrogen cream, rings and tablets.  It is best to take the lowest dose possible that will help your symptoms and take them for the shortest period of time that you can.  Hormone therapy can help relieve some of the problems (symptoms) that affect women at menopause. Before making a decision about HT, talk to your caregiver about what is best for you. Be well informed and comfortable with your decisions. HOME CARE INSTRUCTIONS   Follow your caregivers advice  when taking the medications.  A Pap test is done to screen for cervical cancer.  The first Pap test should be done at age 2.  Between ages 76 and 52, Pap tests are repeated every 2 years.  Beginning at age 55, you are advised to have a Pap test every 3 years as long as the past 3 Pap tests have been normal.  Some women have medical problems that increase the chance of getting cervical cancer. Talk to your caregiver about these problems. It is especially important to talk to your caregiver if a new problem develops soon after your last Pap test. In these cases, your caregiver may recommend more frequent screening and Pap tests.  The above recommendations are the same for women who have or have not gotten the vaccine for HPV (human papillomavirus).  If you had a hysterectomy for a problem that was not a cancer or a condition that could lead to cancer, then you no longer need Pap tests. However, even if you no longer need a Pap test, a regular exam is a good idea to make sure no other problems are starting.  If you are between ages 24 and 61, and you have had normal  Pap tests going back 10 years, you no longer need Pap tests. However, even if you no longer need a Pap test, a regular exam is a good idea to make sure no other problems are starting.  If you have had past treatment for cervical cancer or a condition that could lead to cancer, you need Pap tests and screening for cancer for at least 20 years after your treatment.  If Pap tests have been discontinued, risk factors (such as a new sexual partner)need to be re-assessed to determine if screening should be resumed.  Some women may need screenings more often if they are at high risk for cervical cancer.  Get mammograms done as per the advice of your caregiver. SEEK IMMEDIATE MEDICAL CARE IF:  You develop abnormal vaginal bleeding.  You have pain or swelling in your legs, shortness of breath, or chest pain.  You develop dizziness  or headaches.  You have lumps or changes in your breasts or armpits.  You have slurred speech.  You develop weakness or numbness of your arms or legs.  You have pain, burning, or bleeding when urinating.  You develop abdominal pain.   This information is not intended to replace advice given to you by your health care provider. Make sure you discuss any questions you have with your health care provider.   Document Released: 07/28/2003 Document Revised: 03/15/2015 Document Reviewed: 05/02/2015 Elsevier Interactive Patient Education 2016 [employer] Perimenopause Perimenopause is the time when your body begins to move into the menopause (no menstrual period for 12 straight months). It is a natural process. Perimenopause can begin 2-8 years before the menopause and usually lasts for 1 year after the menopause. During this time, your ovaries may or may not produce an egg. The ovaries vary in their production of estrogen and progesterone hormones each month. This can cause irregular menstrual periods, difficulty getting pregnant, vaginal bleeding between periods, and uncomfortable symptoms. CAUSES  Irregular production of the ovarian hormones, estrogen and progesterone, and not ovulating every month.  Other causes include:  Tumor of the pituitary gland in the brain.  Medical disease that affects the ovaries.  Radiation treatment.  Chemotherapy.  Unknown causes.  Heavy smoking and excessive alcohol intake can bring on perimenopause sooner. SIGNS AND SYMPTOMS   Hot flashes.  Night sweats.  Irregular menstrual periods.  Decreased sex drive.  Vaginal dryness.  Headaches.  Mood swings.  Depression.  Memory problems.  Irritability.  Tiredness.  Weight gain.  Trouble getting pregnant.  The beginning of losing bone cells (osteoporosis).  The beginning of hardening of the arteries (atherosclerosis). DIAGNOSIS  Your health care provider will make a diagnosis by  analyzing your age, menstrual history, and symptoms. He or she will do a physical exam and note any changes in your body, especially your female organs. Female hormone tests may or may not be helpful depending on the amount of female hormones you produce and when you produce them. However, other hormone tests may be helpful to rule out other problems. TREATMENT  In some cases, no treatment is needed. The decision on whether treatment is necessary during the perimenopause should be made by you and your health care provider based on how the symptoms are affecting you and your lifestyle. Various treatments are available, such as:  Treating individual symptoms with a specific medicine for that symptom.  Herbal medicines that can help specific symptoms.  Counseling.  Group therapy. HOME CARE INSTRUCTIONS   Keep track of your menstrual periods (  when they occur, how heavy they are, how long between periods, and how long they last) as well as your symptoms and when they started.  Only take over-the-counter or prescription medicines as directed by your health care provider.  Sleep and rest.  Exercise.  Eat a diet that contains calcium (good for your bones) and soy (acts like the estrogen hormone).  Do not smoke.  Avoid alcoholic beverages.  Take vitamin supplements as recommended by your health care provider. Taking vitamin E may help in certain cases.  Take calcium and vitamin D supplements to help prevent bone loss.  Group therapy is sometimes helpful.  Acupuncture may help in some cases. SEEK MEDICAL CARE IF:   You have questions about any symptoms you are having.  You need a referral to a specialist (gynecologist, psychiatrist, or psychologist). SEEK IMMEDIATE MEDICAL CARE IF:   You have vaginal bleeding.  Your period lasts longer than 8 days.  Your periods are recurring sooner than 21 days.  You have bleeding after intercourse.  You have severe depression.  You have  pain when you urinate.  You have severe headaches.  You have vision problems.   This information is not intended to replace advice given to you by your health care provider. Make sure you discuss any questions you have with your health care provider.   Document Released: 12/06/2004 Document Revised: 11/19/2014 Document Reviewed: 05/28/2013 Elsevier Interactive Patient Education [employer]

## 2016-04-12 NOTE — Telephone Encounter (Signed)
Pt was seen today for Mirena IUD removal and insert of new Mirena IUD , pt said she noticed when she got home she had some bleeding,asked if normal . Never had bleeding with previous IUD, I advise pt that could have bleeding, should not have heavy bleeding such a changing tampon/pad every 1 hour, pt will watch for now and follow up if needed.

## 2016-04-12 NOTE — Progress Notes (Signed)
HPI: Patient is a 55 year old who presented to the office today to remove her expired Mirena IUD with replacement with a new one. Also for consideration of starting hormone replacement therapy. She has hot flashes specially during evening and at night but not during the day. She was seen in the office on November 1 with similar symptoms. She has been Wellbutrin XL 300 mg daily. We will wait to start any HRT after her mammograms. Her recent mammogram last year had demonstrated the following:  FINDINGS: (09/09/2015) Magnification CC and true lateral views demonstrate a 3 mm group of coarse calcifications within the central aspect of the right breast. No additional concerning masses, calcifications or architectural distortion identified within either breast.  Mammographic images were processed with CAD.  IMPRESSION: Probably benign calcifications within the right breast, likely dystrophic in etiology.  RECOMMENDATION: Right breast diagnostic mammography with magnification views in 6 months to demonstrate stability of right breast calcifications.  Mammogram 03/15/2016: FINDINGS: There has been interval resolution of calcifications in the central portion of the right breast. A small rounded partially obscured mass is identified in lower central portion of the right breast and is further evaluated on ultrasound. No other suspicious findings are identified in the right breast.  Targeted ultrasound is performed, showing there is a simple cyst in the 6 o'clock location of the right breast 3 cm from the nipple which measures 0.8 x 0.7 x 0.5 cm. No solid mass or acoustic shadowing identified.  IMPRESSION: 1. Interval resolution of benign-appearing right breast calcifications. 2. Simple cyst in the 6 o'clock location of the right breast. 3. No mammographic or ultrasound evidence for malignancy.  RECOMMENDATION: Screening mammogram is recommended in October 2017.  [facility]  [facility]   ROS: A ROS was performed and pertinent positives and negatives are included in the history.  GENERAL: No fevers or chills. HEENT: No change in vision, no earache, sore throat or sinus congestion. NECK: No pain or stiffness. CARDIOVASCULAR: No chest pain or pressure. No palpitations. PULMONARY: No shortness of breath, cough or wheeze. GASTROINTESTINAL: No abdominal pain, nausea, vomiting or diarrhea, melena or bright red blood per rectum. GENITOURINARY: No urinary frequency, urgency, hesitancy or dysuria. MUSCULOSKELETAL: No joint or muscle pain, no back pain, no recent trauma. DERMATOLOGIC: No rash, no itching, no lesions. ENDOCRINE: No polyuria, polydipsia, no heat or cold intolerance. No recent change in weight. HEMATOLOGICAL: No anemia or easy bruising or bleeding. NEUROLOGIC: No headache, seizures, numbness, tingling or weakness. PSYCHIATRIC: No depression, no loss of interest in normal activity or change in sleep pattern.                                                                      IUD procedure note        She is fully aware that this form of contraception is 99% effective and is good for 5 years.  Pelvic exam: Bartholin urethra Skene glands: Within normal limits Vagina: No lesions or discharge Cervix: No lesions or discharge, IUD string was grasped with a ring forcep retrieved shown to the patient and discarded Uterus: Retroverted position Adnexa: No masses or tenderness Rectal exam: Not done  The cervix was cleansed with Betadine solution. A single-tooth tenaculum was placed on the  anterior cervical lip. The uterus sounded to 6-1/2 centimeter. The IUD was shown to the patient and inserted in a sterile fashion. The IUD string was trimmed. The single-tooth tenaculum was removed. Patient was instructed to return back to the office in one month for follow up.         Assessment Plan: Patient was perimenopausal last year. We are going to check her FSH level today.  Follow-up mammogram had demonstrated benign cyst and a small nonsignificant calcification. We had discussed the risk benefits and pros and cons of hormone replacement therapy. We discussed women's health initiative study. We discussed potential risk of DVT, pulmonary embolism and breast cancer. We will wait a result of this FSH of is in the menopausal range and her symptoms are worsening we will start her on a low transdermal patch Vivelle-Dot 0.025 twice a week and the progesterone from her Mirena IUD will protect her uterine lining.   Greater than 50% of time was spent in counseling and coordinating care of this patient.   Time of consultation: 15   Minutes.

## 2016-04-13 ENCOUNTER — Encounter: Payer: Self-pay | Admitting: Gynecology

## 2016-05-17 ENCOUNTER — Ambulatory Visit: Payer: BC Managed Care – PPO | Admitting: Gynecology

## 2016-06-19 ENCOUNTER — Other Ambulatory Visit: Payer: Self-pay | Admitting: Cardiology

## 2016-06-19 NOTE — Telephone Encounter (Signed)
Rx has been sent to the pharmacy electronically. ° °

## 2016-07-18 ENCOUNTER — Encounter: Payer: Self-pay | Admitting: Cardiology

## 2016-07-18 ENCOUNTER — Ambulatory Visit (INDEPENDENT_AMBULATORY_CARE_PROVIDER_SITE_OTHER): Payer: BC Managed Care – PPO | Admitting: Cardiology

## 2016-07-18 VITALS — BP 128/82 | HR 92 | Ht 66.5 in | Wt 159.4 lb

## 2016-07-18 DIAGNOSIS — E785 Hyperlipidemia, unspecified: Secondary | ICD-10-CM | POA: Diagnosis not present

## 2016-07-18 DIAGNOSIS — R9439 Abnormal result of other cardiovascular function study: Secondary | ICD-10-CM

## 2016-07-18 DIAGNOSIS — Z8249 Family history of ischemic heart disease and other diseases of the circulatory system: Secondary | ICD-10-CM

## 2016-07-18 MED ORDER — ATORVASTATIN CALCIUM 10 MG PO TABS: 10.0000 mg | ORAL_TABLET | Freq: Every day | ORAL | 3 refills | Status: DC

## 2016-07-18 NOTE — Assessment & Plan Note (Signed)
Due for lipids and LFTs 

## 2016-07-18 NOTE — Assessment & Plan Note (Signed)
CPET-met test: Peak VO2 1 108%, heart rate response of 87%; excellent effort. Normal PFTs.  Excellent anaerobic threshold  Heart rate/stroke divergence with increasing heart rate and decreasing stroke volume suggestive of decreased cardiac output.

## 2016-07-18 NOTE — Progress Notes (Signed)
07/18/2016 Teresa Christensen   April 26, 1961  [id number]  Primary Physician FRIED, Jaymes Graff, MD Primary Cardiologist: Dr Ellyn Hack  HPI:  55 y/o female followed by Dr Ellyn Hack. She had a mildly abnormal stress in 2014 for exertional dyspnea. She has a strong family history of CAD. She last saw Dr Ellyn Hack 05/11/16. She denies any chest pain or unusual dyspnea. She denies palpitations. She is running for the senate and hasn't been exercising regularly.     Current Outpatient Prescriptions  Medication Sig Dispense Refill  . buPROPion (WELLBUTRIN XL) 300 MG 24 hr tablet Take 1 tablet by mouth daily. Take 1 tab daily  5  . Ibuprofen (ADVIL PO) Take by mouth. PRN     . levonorgestrel (MIRENA) 20 MCG/24HR IUD 1 each by Intrauterine route once.      . loratadine (CLARITIN) 10 MG tablet Take 10 mg by mouth daily.    . Multiple Vitamin (MULTIVITAMIN) capsule Take 1 capsule by mouth daily.      Marland Kitchen STRATTERA 80 MG capsule Take 1 tablet by mouth daily.  2  . ZOVIRAX 5 % APPLY TOPICALLY EVERY 3 (THREE) HOURS. 30 g 2  . atorvastatin (LIPITOR) 10 MG tablet Take 1 tablet (10 mg total) by mouth daily. Need appointment before further refills 90 tablet 3   No current facility-administered medications for this visit.     No Known Allergies  Social History   Social History  . Marital status: Married    Spouse name: N/A  . Number of children: N/A  . Years of education: N/A   Occupational History  . Not on file.   Social History Main Topics  . Smoking status: Never Smoker  . Smokeless tobacco: Never Used  . Alcohol use 1.2 oz/week    2 Standard drinks or equivalent per week  . Drug use: No  . Sexual activity: Yes    Birth control/ protection: IUD     Comment: Mirena inserted 07-13-10   Other Topics Concern  . Not on file   Social History Narrative  . No narrative on file     Review of Systems: General: negative for chills, fever, night sweats or weight changes.  Cardiovascular: negative  for chest pain, dyspnea on exertion, edema, orthopnea, palpitations, paroxysmal nocturnal dyspnea or shortness of breath Dermatological: negative for rash Respiratory: negative for cough or wheezing Urologic: negative for hematuria Abdominal: negative for nausea, vomiting, diarrhea, bright red blood per rectum, melena, or hematemesis Neurologic: negative for visual changes, syncope, or dizziness All other systems reviewed and are otherwise negative except as noted above.    Blood pressure 128/82, pulse 92, height 5' 6.5" (1.689 m), weight 159 lb 6.4 oz (72.3 kg).  General appearance: alert, cooperative and no distress Neck: no carotid bruit and no JVD Lungs: clear to auscultation bilaterally Heart: regular rate and rhythm Extremities: extremities normal, atraumatic, no cyanosis or edema Skin: Skin color, texture, turgor normal. No rashes or lesions Neurologic: Grossly normal  EKG NSR  ASSESSMENT AND PLAN:   Abnormal finding on cardiovascular stress test - low risk, but with suggestion of exercise-induced myocardial dysfunction CPET-met test: Peak VO2 1 108%, heart rate response of 87%; excellent effort. Normal PFTs.  Excellent anaerobic threshold  Heart rate/stroke divergence with increasing heart rate and decreasing stroke volume suggestive of decreased cardiac output.  Family history of early CAD Mother died 21 y/o of MI  Hyperlipidemia with target LDL less than 100 Due for lipids and LFTs   PLAN  I have asked her to contact her PCP to arrange fasting lipids and LFTs.Her LDL goal is less than 100.  I encouraged her to try and find time for herself to exercise. F/U Dr Ellyn Hack in a year.   [name] PA-C 07/18/2016 8:29 AM

## 2016-07-18 NOTE — Assessment & Plan Note (Signed)
Mother died 53 y/o of MI

## 2016-07-18 NOTE — Patient Instructions (Signed)
Medication Instructions:  Your physician recommends that you continue on your current medications as directed. Please refer to the Current Medication list given to you today.  Labwork: Complete Labs and send a copy to our office  Testing/Procedures: None Ordered  Follow-Up: Your physician wants you to follow-up in: 1 YEAR WITH DR HARDING. You will receive a reminder letter in the mail two months in advance. If you don't receive a letter, please call our office to schedule the follow-up appointment.  Any Other Special Instructions Will Be Listed Below (If Applicable).     If you need a refill on your cardiac medications before your next appointment, please call your pharmacy.

## 2016-10-26 ENCOUNTER — Telehealth: Payer: Self-pay | Admitting: Cardiology

## 2016-10-26 ENCOUNTER — Ambulatory Visit (INDEPENDENT_AMBULATORY_CARE_PROVIDER_SITE_OTHER): Payer: BC Managed Care – PPO | Admitting: Neurology

## 2016-10-26 ENCOUNTER — Encounter: Payer: Self-pay | Admitting: Neurology

## 2016-10-26 VITALS — BP 145/90 | HR 96 | Ht 66.5 in | Wt 159.0 lb

## 2016-10-26 DIAGNOSIS — R413 Other amnesia: Secondary | ICD-10-CM | POA: Diagnosis not present

## 2016-10-26 NOTE — Telephone Encounter (Signed)
Returned call to patient-pt reports neurologist recommended discontinuing Atorvastatin for 3-4 months d/t memory issues (note accessible in Epic).  Advised to stop medication and will route to MD to make aware and for further recommendations.    [facility].

## 2016-10-26 NOTE — Telephone Encounter (Signed)
New message    Pt verbalized that she seen Dr.Willis  neurologist  Pt has been having severe memory issues she wants to stop Averstatin 10mg   for 4 months to see if it will help

## 2016-10-26 NOTE — Progress Notes (Signed)
Reason for visit: Memory disorder  Referring physician: Dr. Renato Battles is a 55 y.o. female  History of present illness:  Teresa Christensen is a 55 year old right-handed white female with a history of some issues with short-term memory over the last 1-2 years. The patient is not clear that the problem has been progressive, but it has been persistent. The patient has had difficulty with remembering people and names. She will misplace things quite frequently. She has had some problems with directions with driving. She does report some difficulty with focusing at times. She is able to manage the finances fairly well, she does have some difficulty keeping up with medications and appointments. She denies any other issues such as fatigue, she sleeps well at night. She denies any headaches or problems with dizziness or difficulty with numbness or weakness of the face, arms, or legs. She denies any balance issues or difficulty controlling the bowels or the bladder. The patient started a statin drug around the time that she began having cognitive issues, and she is going through menopause as well. The patient recently was placed on Strattera, but this offered no benefit and she was switched to Adderall which she believes that over the last 3 weeks has been somewhat helpful in her ability to focus and concentrate and remember. The patient is also on Wellbutrin. She has a history of an anxiety disorder, but this has not been severe for her. She is sent to this office for an evaluation.  Past Medical History:  Diagnosis Date  . CIN III (cervical intraepithelial neoplasia grade III) with severe dysplasia   . Dyslipidemia, goal LDL below 100 October 2014   Total cholesterol 198, LDL 135, HDL 53 --> 7/'16" TC 160, TG 52, HDL 75. LDL 75!!  . Heart disease   . High cholesterol     Past Surgical History:  Procedure Laterality Date  . COLPOSCOPY    . CPET-MET-TEST  November 2014   Effort (RER):  1.18 (>target); peak HR 166 bpm (87% max predicted); HR/Stroke Vol curve discordance (last 5.5 min drop in CO with increasing HR) -> exercise-induced myocardial dysfunction; low risk based on high anaerobic threshold and peak VO2  . ENDOMETRIAL ABLATION  11/2003  . INTRAUTERINE DEVICE INSERTION  07/2010   mirena  . LASER VAPORIZATION OF CERVIX    . TONSILLECTOMY      Family History  Problem Relation Age of Onset  . Heart disease Mother     DIED WITH MI AT AGE 76--WAS A SMOKER  . Emphysema Father   . Hypertension Father   . Hypertension Sister   . Multiple sclerosis Brother   . Breast cancer Maternal Grandmother     Age 83    Social history:  reports that she has never smoked. She has never used smokeless tobacco. She reports that she drinks about 1.2 oz of alcohol per week . She reports that she does not use drugs.  Medications:  Prior to Admission medications   Medication Sig Start Date End Date Taking? Authorizing Provider  amphetamine-dextroamphetamine (ADDERALL) 10 MG tablet Take 10 mg by mouth 2 (two) times daily. 10/16/16   Historical Provider, MD  atorvastatin (LIPITOR) 10 MG tablet Take 1 tablet (10 mg total) by mouth daily. Need appointment before further refills 07/18/16   Erlene Quan, PA-C  buPROPion (WELLBUTRIN XL) 300 MG 24 hr tablet Take 1 tablet by mouth daily. Take 1 tab daily 04/24/15   Historical Provider, MD  Ibuprofen (ADVIL PO) Take by mouth. PRN     Historical Provider, MD  levonorgestrel (MIRENA) 20 MCG/24HR IUD 1 each by Intrauterine route once.   07/13/10   Historical Provider, MD  loratadine (CLARITIN) 10 MG tablet Take 10 mg by mouth daily.    Historical Provider, MD  Multiple Vitamin (MULTIVITAMIN) capsule Take 1 capsule by mouth daily.      Historical Provider, MD  ZOVIRAX 5 % APPLY TOPICALLY EVERY 3 (THREE) HOURS. 03/12/13   Anastasio Auerbach, MD     No Known Allergies  ROS:  Out of a complete 14 system review of symptoms, the patient complains only of  the following symptoms, and all other reviewed systems are negative.  Easy bruising Moles Memory loss Anxiety  Blood pressure (!) 145/90, pulse 96, height 5' 6.5" (1.689 m), weight 159 lb (72.1 kg).  Physical Exam  General: The patient is alert and cooperative at the time of the examination.  Eyes: Pupils are equal, round, and reactive to light. Discs are flat bilaterally.  Neck: The neck is supple, no carotid bruits are noted.  Respiratory: The respiratory examination is clear.  Cardiovascular: The cardiovascular examination reveals a regular rate and rhythm, no obvious murmurs or rubs are noted.  Skin: Extremities are without significant edema.  Neurologic Exam  Mental status: The patient is alert and oriented x 3 at the time of the examination. The patient has apparent normal recent and remote memory, with an apparently normal attention span and concentration ability. Mini-Mental Status Examination done today shows a total score 29/30.  Cranial nerves: Facial symmetry is present. There is good sensation of the face to pinprick and soft touch bilaterally. The strength of the facial muscles and the muscles to head turning and shoulder shrug are normal bilaterally. Speech is well enunciated, no aphasia or dysarthria is noted. Extraocular movements are full. Visual fields are full. The tongue is midline, and the patient has symmetric elevation of the soft palate. No obvious hearing deficits are noted.  Motor: The motor testing reveals 5 over 5 strength of all 4 extremities. Good symmetric motor tone is noted throughout.  Sensory: Sensory testing is intact to pinprick, soft touch, vibration sensation, and position sense on all 4 extremities. No evidence of extinction is noted.  Coordination: Cerebellar testing reveals good finger-nose-finger and heel-to-shin bilaterally.  Gait and station: Gait is normal. Tandem gait is normal. Romberg is negative. No drift is seen.  Reflexes: Deep  tendon reflexes are symmetric and normal bilaterally. Toes are downgoing bilaterally.   Assessment/Plan:  1. Minimum cognitive impairment  The patient likely does not have ADD, she did not report any difficulty with learning and memory and focusing when she was an adolescent or young adult. She did start a statin drug around the time that the cognitive issues change, this could be a potential etiology for her symptoms. The patient also is going through menopause which may impair her ability to focus at times. She will follow-up in 6 months. I have recommended a trial off of the statin medication for 3-4 months to see if this improves her cognitive functioning.  Jill Alexanders MD 10/26/2016 10:05 AM  [facility] [address] [place], [place] [address]  Phone [phone] Fax [phone]

## 2016-10-29 LAB — COPPER, SERUM: COPPER: 93 ug/dL (ref 72–166)

## 2016-10-29 LAB — SEDIMENTATION RATE: SED RATE: 2 mm/h (ref 0–40)

## 2016-10-29 LAB — VITAMIN B12: Vitamin B-12: >2000 pg/mL — ABNORMAL HIGH (ref 232–1245)

## 2016-10-29 NOTE — Telephone Encounter (Signed)
That sounds fine. Usually he should see an effect within about a month  Bryan Lemmaavid Harding, MD

## 2016-10-30 NOTE — Telephone Encounter (Signed)
Left detailed message with MD advice 

## 2016-11-13 ENCOUNTER — Other Ambulatory Visit: Payer: BC Managed Care – PPO

## 2017-03-27 ENCOUNTER — Encounter: Payer: Self-pay | Admitting: Gynecology

## 2017-04-29 ENCOUNTER — Ambulatory Visit: Payer: BC Managed Care – PPO | Admitting: Neurology

## 2017-10-23 ENCOUNTER — Ambulatory Visit: Payer: BC Managed Care – PPO | Admitting: Gynecology

## 2017-10-23 ENCOUNTER — Encounter: Payer: Self-pay | Admitting: Gynecology

## 2017-10-23 ENCOUNTER — Other Ambulatory Visit: Payer: Self-pay | Admitting: Gynecology

## 2017-10-23 VITALS — BP 118/76 | Ht 67.5 in | Wt 152.0 lb

## 2017-10-23 DIAGNOSIS — Z01411 Encounter for gynecological examination (general) (routine) with abnormal findings: Secondary | ICD-10-CM

## 2017-10-23 DIAGNOSIS — N952 Postmenopausal atrophic vaginitis: Secondary | ICD-10-CM

## 2017-10-23 DIAGNOSIS — Z13220 Encounter for screening for lipoid disorders: Secondary | ICD-10-CM

## 2017-10-23 DIAGNOSIS — Z1231 Encounter for screening mammogram for malignant neoplasm of breast: Secondary | ICD-10-CM

## 2017-10-23 NOTE — Progress Notes (Signed)
    Teresa BeckersJennifer R Christensen June 22, 1961 [id number]        56 y.o.  [id number] for annual gynecologic exam.  Former patient of Dr. Lily PeerFernandez.  Doing well without GYN complaints.  Past medical history,surgical history, problem list, medications, allergies, family history and social history were all reviewed and documented as reviewed in the EPIC chart.  ROS:  Performed with pertinent positives and negatives included in the history, assessment and plan.   Additional significant findings : None   Exam: Kennon PortelaKim Christensen assistant Vitals:   10/23/17 1210  BP: 118/76  Weight: 152 lb (68.9 kg)  Height: 5' 7.5" (1.715 m)   Body mass index is 23.46 kg/m.  General appearance:  Normal affect, orientation and appearance. Skin: Grossly normal HEENT: Without gross lesions.  No cervical or supraclavicular adenopathy. Thyroid normal.  Lungs:  Clear without wheezing, rales or rhonchi Cardiac: RR, without RMG Abdominal:  Soft, nontender, without masses, guarding, rebound, organomegaly or hernia Breasts:  Examined lying and sitting without masses, retractions, discharge or axillary adenopathy. Pelvic:  Ext, BUS, Vagina: With mild atrophic changes  Cervix: Normal with IUD string visualized  Uterus: Anteverted, normal size, shape and contour, midline and mobile nontender   Adnexa: Without masses or tenderness    Anus and perineum: Normal   Rectovaginal: Normal sphincter tone without palpated masses or tenderness.    Assessment/Plan:  56 y.o. [id number] female for annual gynecologic exam no menses with Mirena IUD.   1. Perimenopausal.  Remains amenorrheic with Mirena IUD.  [facility]  Was having hot flushes and sweats but resolved when started Wellbutrin.  Continue to monitor and report any issues or bleeding. 2. Mirena IUD replaced 04/2016.  IUD string visualized. 3. Mammography 2017.  Patient reminded she is overdue and agrees to call and schedule.  Breast exam normal today. 4. Pap  smear/HPV 08/2013.  No Pap smear done today.  No history of significant abnormal Pap smears.  Plan repeat Pap smear next year at 5-year interval per current screening guidelines. 5. Colonoscopy never.  Recommended screening colonoscopy and she acknowledges my recommendations and agrees to to arrange. 6. Health maintenance.  Patient requests baseline labs.  Future orders for fasting CBC, CMP, lipid profile placed.  Follow-up in 1 year, sooner as needed.   Dara Lordsimothy P Ramsay Bognar MD, 1:12 PM 10/23/2017

## 2017-10-23 NOTE — Patient Instructions (Signed)
Follow up for fasting lab work  Schedule your colonoscopy with either:  [facility]   Address: [address], [place], [place] [address]  [phone]    or  [facility]  Address: [address], [place], [place] [address]  [phone]       Call to Schedule your mammogram  Facilities in [place]: 1)  [facility], [phone] Phone: [phone] 2)  Dr. Yolanda [facility]Bertrand at [facility]  [address] Phone: [phone]     Mammogram A mammogram is an X-ray test to find changes in a woman's breast. You should get a mammogram if:  You are 54 years of age or older  You have risk factors.   Your doctor recommends that you have one.  BEFORE THE TEST  Do not schedule the test the week before your period, especially if your breasts are sore during this time.  On the day of your mammogram:  Wash your breasts and armpits well. After washing, do not put on any deodorant or talcum powder on until after your test.   Eat and drink as you usually do.   Take your medicines as usual.   If you are diabetic and take insulin, make sure you:   Eat before coming for your test.   Take your insulin as usual.   If you cannot keep your appointment, call before the appointment to cancel. Schedule another appointment.  TEST  You will need to undress from the waist up. You will put on a hospital gown.   Your breast will be put on the mammogram machine, and it will press firmly on your breast with a piece of plastic called a compression paddle. This will make your breast flatter so that the machine can X-ray all parts of your breast.   Both breasts will be X-rayed. Each breast will be X-rayed from above and from the side. An X-ray might need to be taken again if the picture is not good enough.   The mammogram will last about 15 to 30 minutes.  AFTER THE TEST Finding out the  results of your test Ask when your test results will be ready. Make sure you get your test results.  Document Released: 01/25/2009 Document Revised: 10/18/2011 Document Reviewed: 01/25/2009 [facility] Patient Information 2012 [place] [place].

## 2017-11-22 ENCOUNTER — Ambulatory Visit
Admission: RE | Admit: 2017-11-22 | Discharge: 2017-11-22 | Disposition: A | Discharge status: Home / Self Care | Payer: BC Managed Care – PPO | Source: Ambulatory Visit | Attending: Gynecology | Admitting: Gynecology

## 2017-11-22 DIAGNOSIS — Z1231 Encounter for screening mammogram for malignant neoplasm of breast: Secondary | ICD-10-CM

## 2017-12-16 ENCOUNTER — Ambulatory Visit: Payer: BC Managed Care – PPO | Admitting: Gynecology

## 2018-12-08 ENCOUNTER — Other Ambulatory Visit: Payer: Self-pay | Admitting: Gynecology

## 2018-12-08 DIAGNOSIS — Z1231 Encounter for screening mammogram for malignant neoplasm of breast: Secondary | ICD-10-CM

## 2018-12-19 ENCOUNTER — Encounter: Payer: BC Managed Care – PPO | Admitting: Gynecology

## 2019-01-08 ENCOUNTER — Ambulatory Visit: Payer: BC Managed Care – PPO

## 2019-01-12 ENCOUNTER — Ambulatory Visit
Admission: RE | Admit: 2019-01-12 | Discharge: 2019-01-12 | Disposition: A | Discharge status: Home / Self Care | Payer: BC Managed Care – PPO | Source: Ambulatory Visit | Attending: Gynecology | Admitting: Gynecology

## 2019-01-12 DIAGNOSIS — Z1231 Encounter for screening mammogram for malignant neoplasm of breast: Secondary | ICD-10-CM

## 2019-01-14 ENCOUNTER — Other Ambulatory Visit: Payer: Self-pay | Admitting: Gynecology

## 2019-01-14 DIAGNOSIS — R928 Other abnormal and inconclusive findings on diagnostic imaging of breast: Secondary | ICD-10-CM

## 2019-01-19 ENCOUNTER — Ambulatory Visit: Payer: BC Managed Care – PPO | Admitting: Gynecology

## 2019-01-19 ENCOUNTER — Encounter: Payer: Self-pay | Admitting: Gynecology

## 2019-01-19 VITALS — BP 118/78 | Ht 66.5 in | Wt 156.0 lb

## 2019-01-19 DIAGNOSIS — Z30431 Encounter for routine checking of intrauterine contraceptive device: Secondary | ICD-10-CM | POA: Diagnosis not present

## 2019-01-19 DIAGNOSIS — Z1151 Encounter for screening for human papillomavirus (HPV): Secondary | ICD-10-CM

## 2019-01-19 DIAGNOSIS — Z13220 Encounter for screening for lipoid disorders: Secondary | ICD-10-CM

## 2019-01-19 DIAGNOSIS — Z01419 Encounter for gynecological examination (general) (routine) without abnormal findings: Secondary | ICD-10-CM | POA: Diagnosis not present

## 2019-01-19 NOTE — Patient Instructions (Signed)
Follow-up for fasting blood work as arranged. 

## 2019-01-19 NOTE — Progress Notes (Addendum)
    Teresa Christensen [facility] Mar 17, 1961 [id number]        58 y.o.  [id number] for annual gynecologic exam.  Without gynecologic complaints  Past medical history,surgical history, problem list, medications, allergies, family history and social history were all reviewed and documented as reviewed in the EPIC chart.  ROS:  Performed with pertinent positives and negatives included in the history, assessment and plan.   Additional significant findings : None   Exam: Kennon Portela assistant Vitals:   01/19/19 1527  BP: 118/78  Weight: 156 lb (70.8 kg)  Height: 5' 6.5" (1.689 m)   Body mass index is 24.8 kg/m.  General appearance:  Normal affect, orientation and appearance. Skin: Grossly normal HEENT: Without gross lesions.  No cervical or supraclavicular adenopathy. Thyroid normal.  Lungs:  Clear without wheezing, rales or rhonchi Cardiac: RR, without RMG Abdominal:  Soft, nontender, without masses, guarding, rebound, organomegaly or hernia Breasts:  Examined lying and sitting without masses, retractions, discharge or axillary adenopathy. Pelvic:  Ext, BUS, Vagina: Normal with mild atrophic changes  Cervix: Normal.  IUD string visualized.  Pap smear done  Uterus: Anteverted, normal size, shape and contour, midline and mobile nontender   Adnexa: Without masses or tenderness    Anus and perineum: Normal   Rectovaginal: Normal sphincter tone without palpated masses or tenderness.    Assessment/Plan:  58 y.o. [id number] female for annual gynecologic exam.  1. Postmenopausal.  Doing well without significant menopausal symptoms.  Remains amenorrheic with her Mirena IUD.  FSH 2 years ago was 85. 2. Mirena IUD 04/2016.  IUD string visualized.  We will plan on keeping it for her 5-year interval. 3. Pap smear/HPV 08/2013.  Pap smear/HPV today.  No history of abnormal Pap smears previously. 4. Colonoscopy 2019.  Repeat at their recommended interval. 5. Mammography 01/2019.  Had a recall on her  right breast and is going back this week for additional views.  Breast exam normal today. 6. Plan DEXA further into menopause. 7. Health maintenance.  Future orders placed for fasting CBC, CMP and lipid profile.  Follow-up 1 year, sooner as needed.   Dara Lords MD, 3:58 PM 01/19/2019

## 2019-01-19 NOTE — Addendum Note (Signed)
Addended by: Dayna Barker on: 01/19/2019 04:29 PM   Modules accepted: Orders

## 2019-01-20 LAB — PAP IG AND HPV HIGH-RISK: HPV DNA High Risk: NOT DETECTED

## 2019-01-21 ENCOUNTER — Ambulatory Visit
Admission: RE | Admit: 2019-01-21 | Discharge: 2019-01-21 | Disposition: A | Discharge status: Home / Self Care | Payer: BC Managed Care – PPO | Source: Ambulatory Visit | Attending: Gynecology | Admitting: Gynecology

## 2019-01-21 ENCOUNTER — Other Ambulatory Visit: Payer: Self-pay

## 2019-01-21 ENCOUNTER — Other Ambulatory Visit: Payer: Self-pay | Admitting: Gynecology

## 2019-01-21 DIAGNOSIS — R928 Other abnormal and inconclusive findings on diagnostic imaging of breast: Secondary | ICD-10-CM

## 2019-01-21 DIAGNOSIS — N6489 Other specified disorders of breast: Secondary | ICD-10-CM

## 2019-07-27 ENCOUNTER — Other Ambulatory Visit: Payer: BC Managed Care – PPO

## 2019-07-31 ENCOUNTER — Other Ambulatory Visit: Payer: BC Managed Care – PPO

## 2019-08-05 ENCOUNTER — Encounter: Payer: Self-pay | Admitting: Gynecology

## 2019-08-13 ENCOUNTER — Ambulatory Visit
Admission: RE | Admit: 2019-08-13 | Discharge: 2019-08-13 | Disposition: A | Discharge status: Home / Self Care | Payer: BC Managed Care – PPO | Source: Ambulatory Visit | Attending: Gynecology | Admitting: Gynecology

## 2019-08-13 ENCOUNTER — Other Ambulatory Visit: Payer: Self-pay

## 2019-08-13 ENCOUNTER — Ambulatory Visit: Payer: BC Managed Care – PPO

## 2019-08-13 DIAGNOSIS — N6489 Other specified disorders of breast: Secondary | ICD-10-CM

## 2020-10-04 ENCOUNTER — Ambulatory Visit: Payer: BC Managed Care – PPO | Admitting: Nurse Practitioner

## 2020-10-04 ENCOUNTER — Other Ambulatory Visit: Payer: Self-pay

## 2020-10-04 ENCOUNTER — Encounter: Payer: Self-pay | Admitting: Nurse Practitioner

## 2020-10-04 VITALS — BP 118/76 | Ht 66.0 in | Wt 158.0 lb

## 2020-10-04 DIAGNOSIS — Z01419 Encounter for gynecological examination (general) (routine) without abnormal findings: Secondary | ICD-10-CM

## 2020-10-04 DIAGNOSIS — Z30431 Encounter for routine checking of intrauterine contraceptive device: Secondary | ICD-10-CM

## 2020-10-04 DIAGNOSIS — Z13220 Encounter for screening for lipoid disorders: Secondary | ICD-10-CM

## 2020-10-04 DIAGNOSIS — Z8632 Personal history of gestational diabetes: Secondary | ICD-10-CM | POA: Diagnosis not present

## 2020-10-04 LAB — CBC WITH DIFFERENTIAL/PLATELET
Basophils Relative: 1 %
MCHC: 34.4 g/dL (ref 32.0–36.0)
RDW: 11.9 % (ref 11.0–15.0)

## 2020-10-04 LAB — COMPREHENSIVE METABOLIC PANEL
AG Ratio: 2.1 (calc) (ref 1.0–2.5)
Globulin: 2.1 g/dL (calc) (ref 1.9–3.7)
Total Protein: 6.5 g/dL (ref 6.1–8.1)

## 2020-10-04 LAB — LIPID PANEL: HDL: 73 mg/dL (ref >=50)

## 2020-10-04 NOTE — Progress Notes (Signed)
   Teresa Christensen [facility] [birth year] [id number]   History:  59 y.o. [id number] presents for annual exam. Mirena IUD inserted June 2017. Postmenopausal, FSH 85 in 2017. CIN-3 with laser vaporization around 15 years ago, subsequent paps normal. 2005 endometrial ablation. March 2020 probable benign right breast mass with normal ultrasound follow up October 2020, otherwise normal mammogram history.   Gynecologic History No LMP recorded. (Menstrual status: IUD).   Contraception: IUD Last Pap: 01/19/2019. Results were: normal Last mammogram: 01/12/2019. Results were: probable benign right breast mass with normal ultrasound follow up October 2020 Last colonoscopy: 12/27/2017. Results were: polyps Last Dexa: Never  Past medical history, past surgical history, family history and social history were all reviewed and documented in the EPIC chart.  ROS:  A ROS was performed and pertinent positives and negatives are included.  Exam:  Vitals:   10/04/20 1148  BP: 118/76  Weight: 158 lb (71.7 kg)  Height: 5\' 6"  (1.676 m)   Body mass index is 25.5 kg/m.  General appearance:  Normal Thyroid:  Symmetrical, normal in size, without palpable masses or nodularity. Respiratory  Auscultation:  Clear without wheezing or rhonchi Cardiovascular  Auscultation:  Regular rate, without rubs, murmurs or gallops  Edema/varicosities:  Not grossly evident Abdominal  Soft,nontender, without masses, guarding or rebound.  Liver/spleen:  No organomegaly noted  Hernia:  None appreciated  Skin  Inspection:  Grossly normal   Breasts: Examined lying and sitting.   Right: Without masses, retractions, discharge or axillary adenopathy.   Left: Without masses, retractions, discharge or axillary adenopathy. Gentitourinary   Inguinal/mons:  Normal without inguinal adenopathy  External genitalia:  Normal  BUS/Urethra/Skene's glands:  Normal  Vagina:  Normal  Cervix:  Normal , IUD string visible  Uterus:  Nnormal in  size, shape and contour.  Midline and mobile  Adnexa/parametria:     Rt: Without masses or tenderness.   Lt: Without masses or tenderness.  Anus and perineum: Normal  Digital rectal exam: Normal sphincter tone without palpated masses or tenderness  Assessment/Plan:  59 y.o. 60 for annual exam.   Well female exam with routine gynecological exam - Plan: CBC with Differential/Platelet, Comprehensive metabolic panel. Education provided on SBEs, importance of preventative screenings, current guidelines, high calcium diet, regular exercise, and multivitamin daily.   Encounter for routine checking of intrauterine contraceptive device (IUD) - Mirena inserted June 2017. Amenorrheic. FSH 85 in 2017. No menopausal symptoms.   History of gestational diabetes - Plan: Hemoglobin A1c  Lipid screening - Plan: Lipid panel  Screening for cervical cancer -CIN-3 greater than 15 years ago with laser vaporization, subsequent Paps normal.  Will repeat at 5-year interval per guidelines.  Screening for breast cancer -overdue for screening mammogram.  She is aware and will schedule this soon.  Normal breast exam today.  Screening for colon cancer -many polyps found on most recent colonoscopy and she is not sure when she is supposed to follow-up.  She plans to call to check on this.  Follow-up in 1 year for annual.       2018 [facility], 12:08 PM 10/04/2020

## 2020-10-04 NOTE — Patient Instructions (Signed)

## 2020-10-05 LAB — CBC WITH DIFFERENTIAL/PLATELET
Absolute Monocytes: 311 cells/uL (ref 200–950)
Basophils Absolute: 51 cells/uL (ref 0–200)
Eosinophils Absolute: 250 cells/uL (ref 15–500)
Eosinophils Relative: 4.9 %
HCT: 39.8 % (ref 35.0–45.0)
Hemoglobin: 13.7 g/dL (ref 11.7–15.5)
Lymphs Abs: 1683 cells/uL (ref 850–3900)
MCH: 31.1 pg (ref 27.0–33.0)
MCV: 90.2 fL (ref 80.0–100.0)
MPV: 9.8 fL (ref 7.5–12.5)
Monocytes Relative: 6.1 %
Neutro Abs: 2805 cells/uL (ref 1500–7800)
Neutrophils Relative %: 55 %
Platelets: 249 10*3/uL (ref 140–400)
RBC: 4.41 10*6/uL (ref 3.80–5.10)
Total Lymphocyte: 33 %
WBC: 5.1 10*3/uL (ref 3.8–10.8)

## 2020-10-05 LAB — COMPREHENSIVE METABOLIC PANEL
ALT: 14 U/L (ref 6–29)
AST: 18 U/L (ref 10–35)
Albumin: 4.4 g/dL (ref 3.6–5.1)
Alkaline phosphatase (APISO): 60 U/L (ref 37–153)
BUN: 15 mg/dL (ref 7–25)
CO2: 28 mmol/L (ref 20–32)
Calcium: 9.7 mg/dL (ref 8.6–10.4)
Chloride: 105 mmol/L (ref 98–110)
Creat: 0.87 mg/dL (ref 0.50–1.05)
Glucose, Bld: 100 mg/dL — ABNORMAL HIGH (ref 65–99)
Potassium: 4.1 mmol/L (ref 3.5–5.3)
Sodium: 140 mmol/L (ref 135–146)
Total Bilirubin: 0.6 mg/dL (ref 0.2–1.2)

## 2020-10-05 LAB — LIPID PANEL
Cholesterol: 232 mg/dL — ABNORMAL HIGH (ref <200)
LDL Cholesterol (Calc): 144 mg/dL (calc) — ABNORMAL HIGH
Non-HDL Cholesterol (Calc): 159 mg/dL (calc) — ABNORMAL HIGH (ref <130)
Total CHOL/HDL Ratio: 3.2 (calc) (ref <5.0)
Triglycerides: 54 mg/dL (ref <150)

## 2020-10-05 LAB — HEMOGLOBIN A1C
Hgb A1c MFr Bld: 5.3 % of total Hgb (ref <5.7)
Mean Plasma Glucose: 105 (calc)
eAG (mmol/L): 5.8 (calc)

## 2021-10-09 ENCOUNTER — Other Ambulatory Visit: Payer: Self-pay

## 2021-10-09 ENCOUNTER — Ambulatory Visit (INDEPENDENT_AMBULATORY_CARE_PROVIDER_SITE_OTHER): Payer: BC Managed Care – PPO | Admitting: Nurse Practitioner

## 2021-10-09 ENCOUNTER — Encounter: Payer: Self-pay | Admitting: Nurse Practitioner

## 2021-10-09 VITALS — BP 124/82 | Ht 66.0 in | Wt 154.0 lb

## 2021-10-09 DIAGNOSIS — Z780 Asymptomatic menopausal state: Secondary | ICD-10-CM

## 2021-10-09 DIAGNOSIS — Z01419 Encounter for gynecological examination (general) (routine) without abnormal findings: Secondary | ICD-10-CM | POA: Diagnosis not present

## 2021-10-09 DIAGNOSIS — Z30431 Encounter for routine checking of intrauterine contraceptive device: Secondary | ICD-10-CM | POA: Diagnosis not present

## 2021-10-09 NOTE — Progress Notes (Signed)
Teresa Christensen [facility] 12/21/1960 [id number]   History:  [name] presents for annual exam. Mirena IUD inserted 04/2016. FSH 85 in 2017. She requests to keep in as she feels it helps with dryness. CIN-3 with laser vaporization around 15-20 years ago, subsequent paps normal.   Gynecologic History No LMP recorded. (Menstrual status: IUD).   Contraception/Family planning: IUD and post menopausal status Sexually active: Yes  Health Maintenance Last Pap: 01/19/2019. Results were: Normal, 5-year repeat Last mammogram: 08/13/2019. Results were: Normal Last colonoscopy: 12/17/2017. Results were: Tubular adenoma, 3-year recall Last Dexa: 2019 per patient. Results were: Normal  Past medical history, past surgical history, family history and social history were all reviewed and documented in the EPIC chart. 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 2 Teresa Christensen age 60 and 41.   ROS:  A ROS was performed and pertinent positives and negatives are included.  Exam:  Vitals:   10/09/21 1346  BP: 124/82  Weight: 154 lb (69.9 kg)  Height: 5\' 6"  (1.676 m)    Body mass index is 24.86 kg/m.  General appearance:  Normal Thyroid:  Symmetrical, normal in size, without palpable masses or nodularity. Respiratory  Auscultation:  Clear without wheezing or rhonchi Cardiovascular  Auscultation:  Regular rate, without rubs, murmurs or gallops  Edema/varicosities:  Not grossly evident Abdominal  Soft,nontender, without masses, guarding or rebound.  Liver/spleen:  No organomegaly noted  Hernia:  None appreciated  Skin  Inspection:  Grossly normal   Breasts: Examined lying and sitting.   Right: Without masses, retractions, discharge or axillary adenopathy.   Left: Without masses, retractions, discharge or axillary adenopathy. Genitourinary   Inguinal/mons:  Normal without inguinal adenopathy  External genitalia:  Normal appearing vulva with no masses, tenderness, or lesions  BUS/Urethra/Skene's glands:   Normal  Vagina:  Normal appearing with normal color and discharge, no lesions  Cervix:  Normal appearing without discharge or lesions. IUD string visible  Uterus:  Normal in size, shape and contour.  Midline and mobile, nontender  Adnexa/parametria:     Rt: Normal in size, without masses or tenderness.   Lt: Normal in size, without masses or tenderness.  Anus and perineum: Normal  Digital rectal exam: Normal sphincter tone without palpated masses or tenderness  Patient informed chaperone available to be present for breast and pelvic exam. Patient has requested no chaperone to be present. Patient has been advised what will be completed during breast and pelvic exam.   Assessment/Plan:  60 y.o. 41 for annual exam.   Well female exam with routine gynecological exam - Education provided on SBEs, importance of preventative screenings, current guidelines, high calcium diet, regular exercise, and multivitamin daily. Labs with PCP.   Encounter for routine checking of intrauterine contraceptive device (IUD) - Mirena IUD inserted 04/2016. She requests to keep in as she feels it helps with dryness. She is aware of FDA approval for 8 years and wants to keep in place. Will reassess next year. IUD string visible.  Postmenopausal - no HRT.   Screening for cervical cancer - CIN-3 greater than 15 years ago with laser vaporization, subsequent Paps normal.  Will repeat at 5-year interval per guidelines.  Screening for breast cancer - Overdue for screening mammogram.  She is aware and will schedule this soon. Information provided on [facility]. Normal breast exam today.  Screening for colon cancer - 2019 colonoscopy. Overdue but she has consultation scheduled next month.   Screening for osteoporosis - Normal bone density in 2019. Will repeat at  5-year interval per recommendation.   Follow-up in 1 year for annual.       [name] 2:26 PM 10/09/2021

## 2022-01-29 ENCOUNTER — Other Ambulatory Visit: Payer: Self-pay | Admitting: Nurse Practitioner

## 2022-01-29 DIAGNOSIS — Z1231 Encounter for screening mammogram for malignant neoplasm of breast: Secondary | ICD-10-CM

## 2022-02-19 ENCOUNTER — Ambulatory Visit
Admission: RE | Admit: 2022-02-19 | Discharge: 2022-02-19 | Disposition: A | Discharge status: Home / Self Care | Payer: BC Managed Care – PPO | Source: Ambulatory Visit | Attending: Nurse Practitioner | Admitting: Nurse Practitioner

## 2022-02-19 DIAGNOSIS — Z1231 Encounter for screening mammogram for malignant neoplasm of breast: Secondary | ICD-10-CM

## 2022-08-06 IMAGING — MG MM DIGITAL SCREENING BILAT W/ TOMO AND CAD
8 series · 8 of 24 positions shown · non-contrast
Comparison: Previous exam(s).

CLINICAL DATA: Screening.

EXAM:
DIGITAL SCREENING BILATERAL MAMMOGRAM WITH TOMOSYNTHESIS AND CAD
TECHNIQUE: Bilateral screening digital craniocaudal and mediolateral oblique
mammograms were obtained. Bilateral screening digital breast
tomosynthesis was performed. The images were evaluated with
computer-aided detection.

[R CC synth-2D]
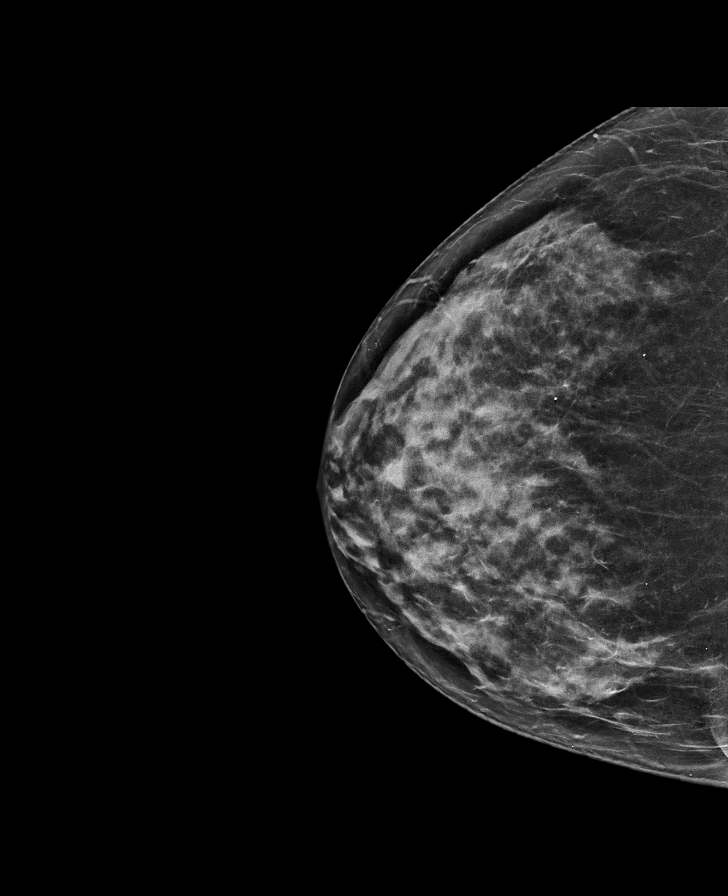

[L MLO synth-2D]
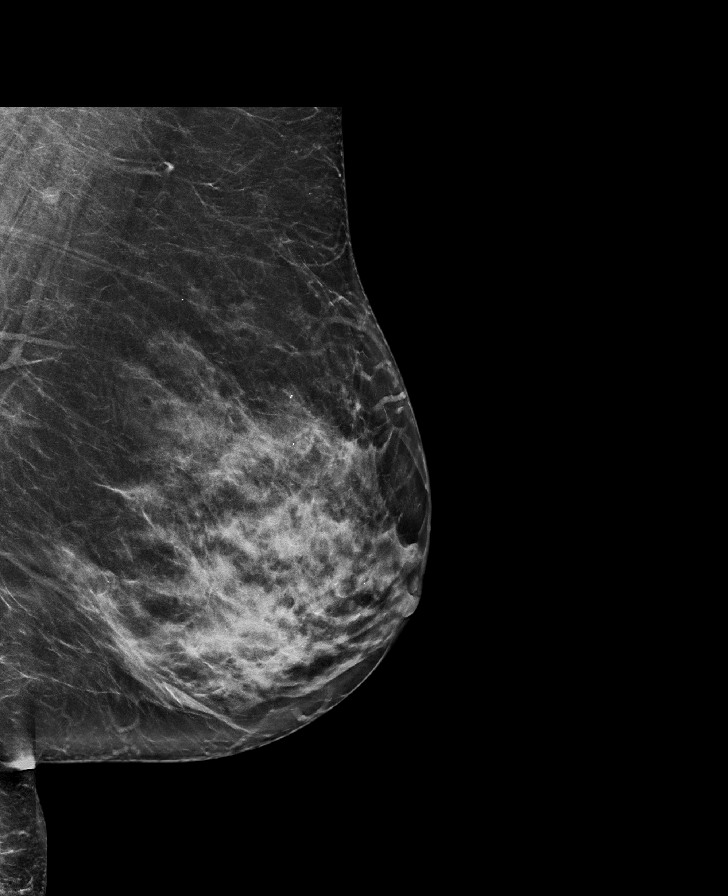

[L CC synth-2D]
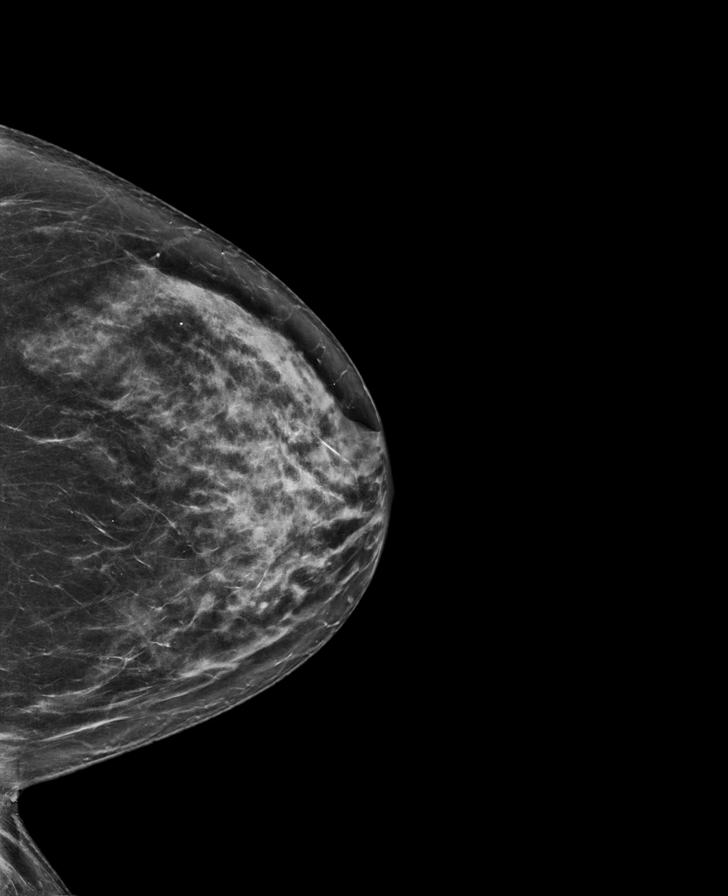

[R MLO synth-2D]
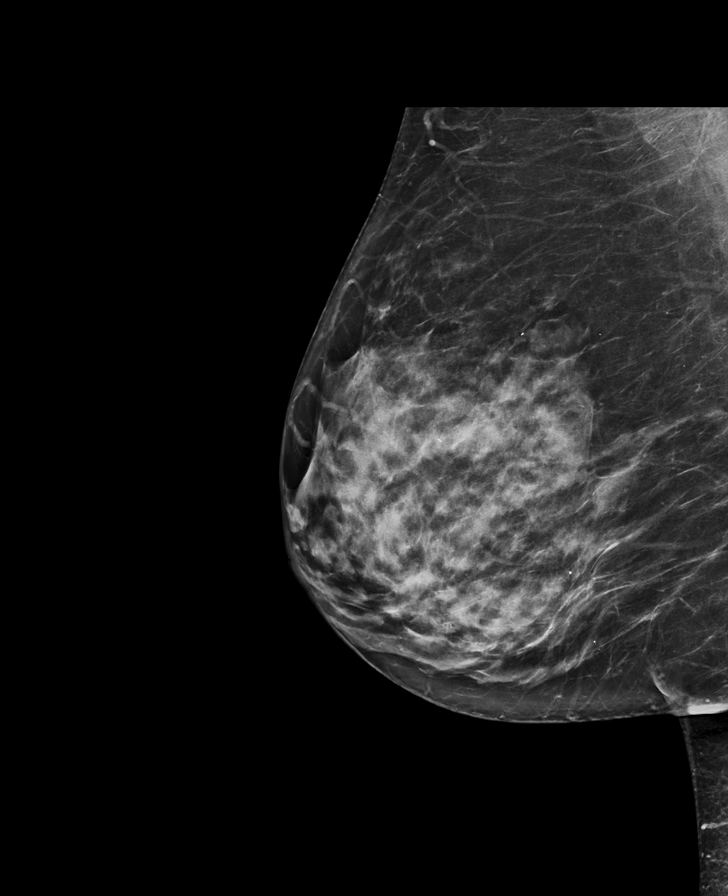

[R MLO tomo · tomo slice 41/80.0]
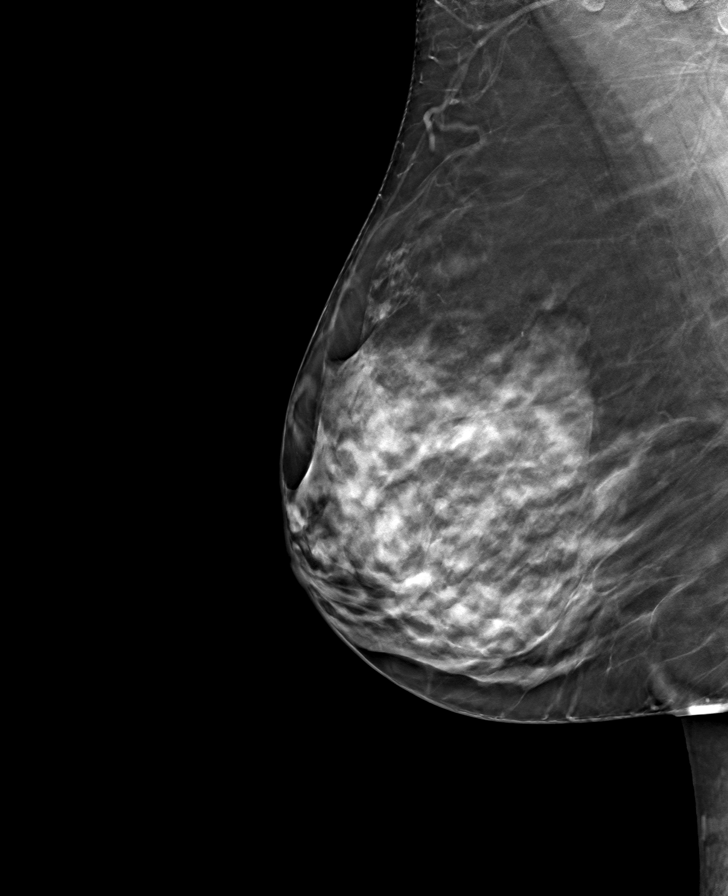

[L CC tomo · tomo slice 40/79.0]
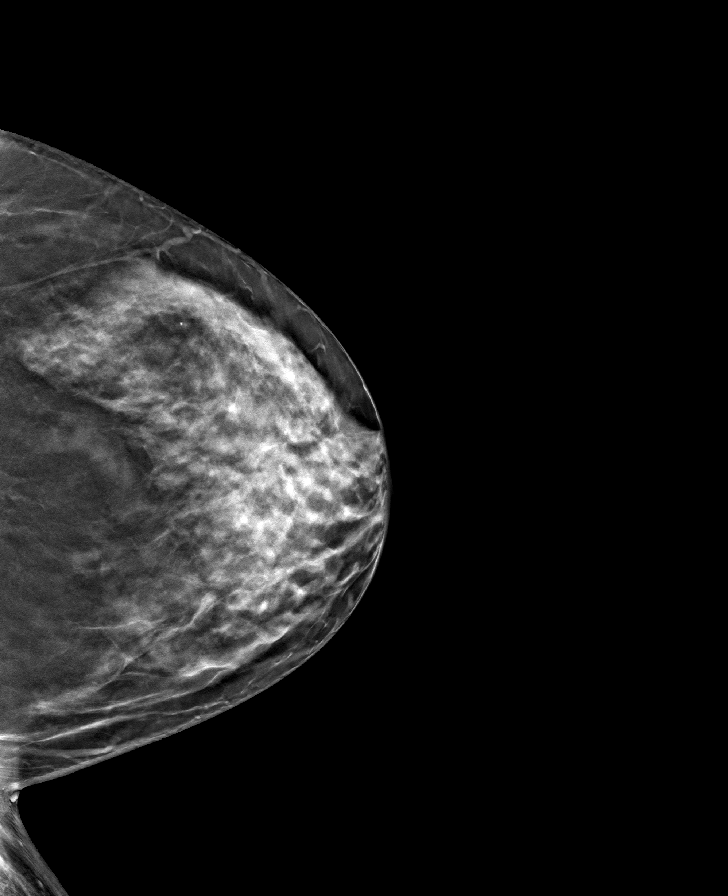

[L MLO tomo · tomo slice 40/79.0]
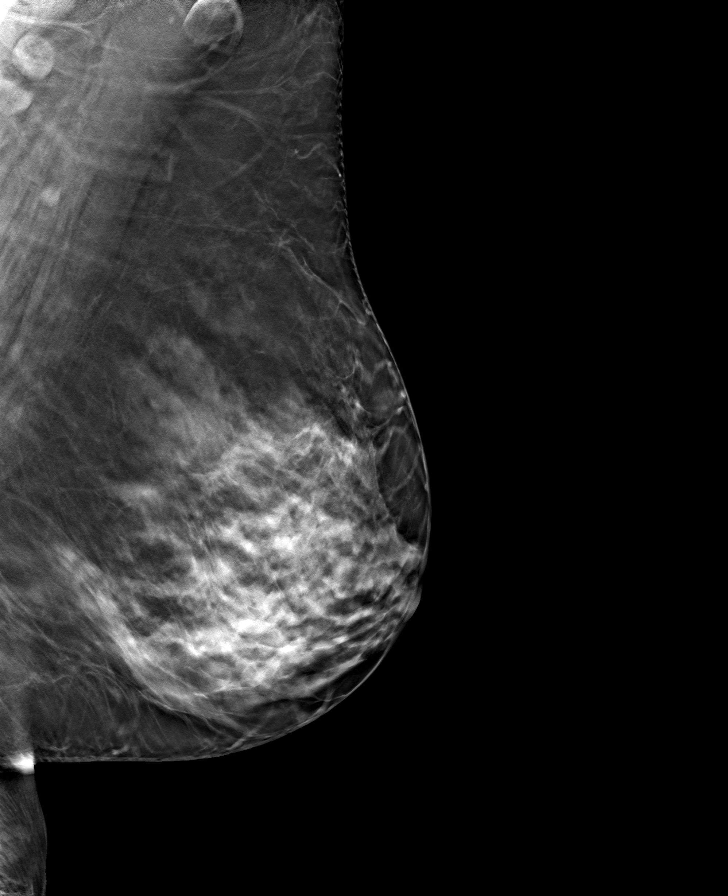

[R CC tomo · tomo slice 38/75.0]
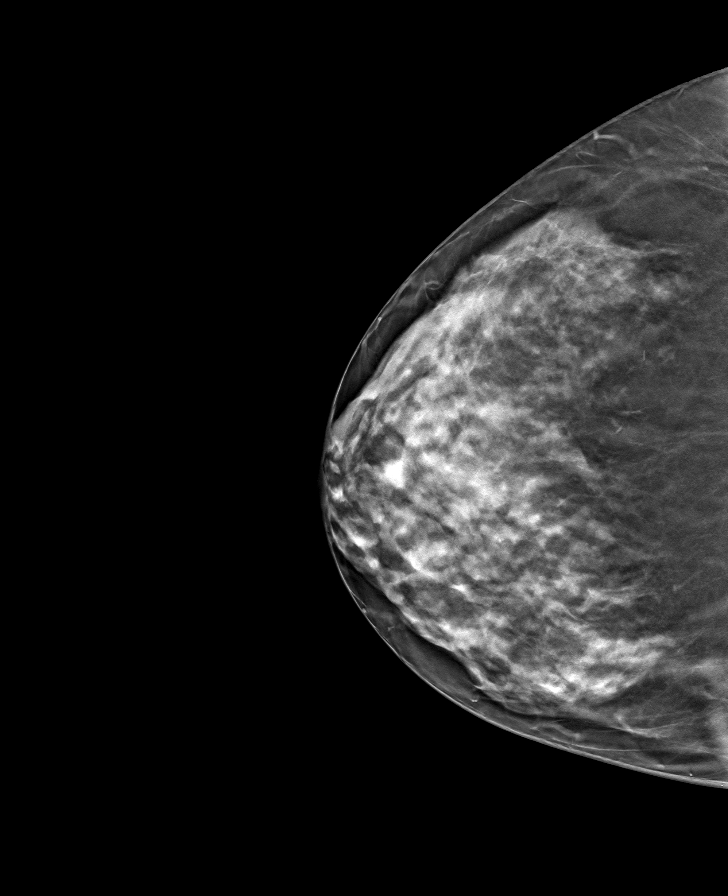

[8 of 24 positions shown; findings below may reference images not displayed]

ACR Breast Density Category d: The breast tissue is extremely dense,
which lowers the sensitivity of mammography
FINDINGS: There are no findings suspicious for malignancy.
IMPRESSION: No mammographic evidence of malignancy. A result letter of this
screening mammogram will be mailed directly to the patient.

RECOMMENDATION:
Screening mammogram in one year. (Code:[id number]-[id number])

BI-RADS CATEGORY  1: Negative.

## 2022-10-10 ENCOUNTER — Ambulatory Visit (INDEPENDENT_AMBULATORY_CARE_PROVIDER_SITE_OTHER): Payer: BC Managed Care – PPO | Admitting: Nurse Practitioner

## 2022-10-10 ENCOUNTER — Encounter: Payer: Self-pay | Admitting: Nurse Practitioner

## 2022-10-10 VITALS — BP 108/76 | Ht 65.75 in | Wt 158.0 lb

## 2022-10-10 DIAGNOSIS — E785 Hyperlipidemia, unspecified: Secondary | ICD-10-CM | POA: Diagnosis not present

## 2022-10-10 DIAGNOSIS — Z01419 Encounter for gynecological examination (general) (routine) without abnormal findings: Secondary | ICD-10-CM | POA: Diagnosis not present

## 2022-10-10 DIAGNOSIS — Z30431 Encounter for routine checking of intrauterine contraceptive device: Secondary | ICD-10-CM

## 2022-10-10 DIAGNOSIS — Z87898 Personal history of other specified conditions: Secondary | ICD-10-CM

## 2022-10-10 NOTE — Progress Notes (Signed)
Teresa Christensen [facility] 03-May-1961 [id number]   History:  61 y.o. [id number] presents for annual exam. Mirena IUD inserted 04/2016. FSH 85 in 2017. She has requested to keep in as she feels it helps with dryness. CIN-3 with laser vaporization around 15-20 years ago, subsequent paps normal. HLD managed by PCP.   Gynecologic History No LMP recorded. (Menstrual status: IUD).   Contraception/Family planning: IUD and post menopausal status Sexually active: Yes  Health Maintenance Last Pap: 01/19/2019. Results were: Normal, 5-year repeat Last mammogram: 02/19/2022. Results were: Normal Last colonoscopy: 2023 per patient.  Results were: Polyps, 5-year recall Last Dexa: 2019 per patient. Results were: Normal  Past medical history, past surgical history, family history and social history were all reviewed and documented in the EPIC chart. 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   ROS:  A ROS was performed and pertinent positives and negatives are included.  Exam:  Vitals:   10/10/22 1001  BP: 108/76  Weight: 158 lb (71.7 kg)  Height: 5' 5.75" (1.67 m)     Body mass index is 25.7 kg/m.  General appearance:  Normal Thyroid:  Symmetrical, normal in size, without palpable masses or nodularity. Respiratory  Auscultation:  Clear without wheezing or rhonchi Cardiovascular  Auscultation:  Regular rate, without rubs, murmurs or gallops  Edema/varicosities:  Not grossly evident Abdominal  Soft,nontender, without masses, guarding or rebound.  Liver/spleen:  No organomegaly noted  Hernia:  None appreciated  Skin  Inspection:  Grossly normal   Breasts: Examined lying and sitting.   Right: Without masses, retractions, discharge or axillary adenopathy.   Left: Without masses, retractions, discharge or axillary adenopathy. Genitourinary   Inguinal/mons:  Normal without inguinal adenopathy  External genitalia:  Normal appearing vulva with no masses, tenderness, or  lesions  BUS/Urethra/Skene's glands:  Normal  Vagina:  Normal appearing with normal color and discharge, no lesions  Cervix:  Normal appearing without discharge or lesions. IUD string visible  Uterus:  Normal in size, shape and contour.  Midline and mobile, nontender  Adnexa/parametria:     Rt: Normal in size, without masses or tenderness.   Lt: Normal in size, without masses or tenderness.  Anus and perineum: Normal  Digital rectal exam: Normal sphincter tone without palpated masses or tenderness  Patient informed chaperone available to be present for breast and pelvic exam. Patient has requested no chaperone to be present. Patient has been advised what will be completed during breast and pelvic exam.   Assessment/Plan:  61 y.o. [id number] for annual exam.   Well female exam with routine gynecological exam - Education provided on SBEs, importance of preventative screenings, current guidelines, high calcium diet, regular exercise, and multivitamin daily. Labs with PCP.   Encounter for routine checking of intrauterine contraceptive device (IUD) - Mirena IUD inserted 04/2016. She requests to keep in as she feels it helps with dryness. She is aware of FDA approval for 8 years and wants to keep in place. Will plan to remove at annual next year.  Screening for cervical cancer - CIN-3 greater than 15-20 years ago with laser vaporization, subsequent Paps normal.  Will repeat at 5-year interval per guidelines.  Screening for breast cancer - UTD. Continue annual screenings. Normal breast exam today.  Screening for colon cancer - 2023 colonoscopy. Will repeat at 5-year interval per GI recommendation.   Screening for osteoporosis - Normal bone density in 2019. Will repeat at 5-year interval per recommendation.   Follow-up in 1 year for annual.  Olivia Mackie [facility], 10:08 AM 10/10/2022

## 2023-08-27 DIAGNOSIS — Z23 Encounter for immunization: Secondary | ICD-10-CM | POA: Diagnosis not present

## 2023-08-27 DIAGNOSIS — E785 Hyperlipidemia, unspecified: Secondary | ICD-10-CM | POA: Diagnosis not present

## 2023-08-27 DIAGNOSIS — Z0000 Encounter for general adult medical examination without abnormal findings: Secondary | ICD-10-CM | POA: Diagnosis not present

## 2023-08-27 DIAGNOSIS — F411 Generalized anxiety disorder: Secondary | ICD-10-CM | POA: Diagnosis not present

## 2023-08-27 DIAGNOSIS — F909 Attention-deficit hyperactivity disorder, unspecified type: Secondary | ICD-10-CM | POA: Diagnosis not present

## 2023-08-28 ENCOUNTER — Other Ambulatory Visit: Payer: Self-pay | Admitting: Family Medicine

## 2023-08-28 DIAGNOSIS — Z1231 Encounter for screening mammogram for malignant neoplasm of breast: Secondary | ICD-10-CM

## 2023-09-23 ENCOUNTER — Ambulatory Visit
Admission: RE | Admit: 2023-09-23 | Discharge: 2023-09-23 | Disposition: A | Discharge status: Home / Self Care | Payer: BC Managed Care – PPO | Source: Ambulatory Visit | Attending: Family Medicine | Admitting: Family Medicine

## 2023-09-23 DIAGNOSIS — Z1231 Encounter for screening mammogram for malignant neoplasm of breast: Secondary | ICD-10-CM | POA: Diagnosis not present

## 2023-09-26 ENCOUNTER — Other Ambulatory Visit: Payer: Self-pay | Admitting: Family Medicine

## 2023-09-26 DIAGNOSIS — R928 Other abnormal and inconclusive findings on diagnostic imaging of breast: Secondary | ICD-10-CM

## 2023-10-15 ENCOUNTER — Ambulatory Visit
Admission: RE | Admit: 2023-10-15 | Discharge: 2023-10-15 | Disposition: A | Discharge status: Home / Self Care | Payer: BC Managed Care – PPO | Source: Ambulatory Visit | Attending: Family Medicine | Admitting: Family Medicine

## 2023-10-15 DIAGNOSIS — N6002 Solitary cyst of left breast: Secondary | ICD-10-CM | POA: Diagnosis not present

## 2023-10-15 DIAGNOSIS — R928 Other abnormal and inconclusive findings on diagnostic imaging of breast: Secondary | ICD-10-CM

## 2023-11-12 ENCOUNTER — Encounter: Payer: Self-pay | Admitting: Nurse Practitioner

## 2023-11-12 ENCOUNTER — Other Ambulatory Visit (HOSPITAL_COMMUNITY)
Admission: RE | Admit: 2023-11-12 | Discharge: 2023-11-12 | Disposition: A | Discharge status: Home / Self Care | Payer: BC Managed Care – PPO | Source: Ambulatory Visit | Attending: Nurse Practitioner | Admitting: Nurse Practitioner

## 2023-11-12 ENCOUNTER — Ambulatory Visit (INDEPENDENT_AMBULATORY_CARE_PROVIDER_SITE_OTHER): Payer: BC Managed Care – PPO | Admitting: Nurse Practitioner

## 2023-11-12 VITALS — BP 110/76 | HR 71 | Ht 65.75 in | Wt 155.0 lb

## 2023-11-12 DIAGNOSIS — Z124 Encounter for screening for malignant neoplasm of cervix: Secondary | ICD-10-CM | POA: Insufficient documentation

## 2023-11-12 DIAGNOSIS — Z01419 Encounter for gynecological examination (general) (routine) without abnormal findings: Secondary | ICD-10-CM

## 2023-11-12 DIAGNOSIS — Z30432 Encounter for removal of intrauterine contraceptive device: Secondary | ICD-10-CM

## 2023-11-12 NOTE — Progress Notes (Signed)
 Cortney Beissel [facility] 02-27-1961 [id number]   History:  62 y.o. [id number] presents for annual exam. Mirena IUD 04/2016. She has requested to keep in as she feels it helps with dryness. CIN-3 with laser vaporization around 15-20 years ago, subsequent paps normal. HLD managed by PCP.   Gynecologic History No LMP recorded. (Menstrual status: IUD).   Contraception/Family planning: IUD and post menopausal status Sexually active: Yes  Health Maintenance Last Pap: 01/19/2019. Results were: Normal neg HPV Last mammogram: 09/23/2023. Results were: Possible left breast mass, follow up imaging showed cyst Last colonoscopy: 2023 per patient.  Results were: Polyps, 5-year recall Last Dexa: 12/27/2017. Results were: Normal  Past medical history, past surgical history, family history and social history were all reviewed and documented in the EPIC chart. Boyfriend. Left [employer] this year after 15 years. 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 2 daughters age 79 and 25. Both live in [place].   ROS:  A ROS was performed and pertinent positives and negatives are included.  Exam:  Vitals:   11/12/23 1020  BP: 110/76  Pulse: 71  SpO2: 99%  Weight: 155 lb (70.3 kg)  Height: 5' 5.75 (1.67 m)      Body mass index is 25.21 kg/m.  General appearance:  Normal Thyroid:  Symmetrical, normal in size, without palpable masses or nodularity. Respiratory  Auscultation:  Clear without wheezing or rhonchi Cardiovascular  Auscultation:  Regular rate, without rubs, murmurs or gallops  Edema/varicosities:  Not grossly evident Abdominal  Soft,nontender, without masses, guarding or rebound.  Liver/spleen:  No organomegaly noted  Hernia:  None appreciated  Skin  Inspection:  Grossly normal   Breasts: Examined lying and sitting.   Right: Without masses, retractions, discharge or axillary adenopathy.   Left: Without masses, retractions, discharge or axillary adenopathy. Pelvic: External genitalia:  no  lesions              Urethra:  normal appearing urethra with no masses, tenderness or lesions              Bartholins and Skenes: normal                 Vagina: normal appearing vagina with normal color and discharge, no lesions              Cervix: no lesions. IUD strings visible Bimanual Exam:  Uterus:  no masses or tenderness              Adnexa: no mass, fullness, tenderness              Rectovaginal: Deferred              Anus:  normal, no lesions  Patient informed chaperone available to be present for breast and pelvic exam. Patient has requested no chaperone to be present. Patient has been advised what will be completed during breast and pelvic exam.   Assessment/Plan:  62 y.o. [id number] for annual exam.   Well female exam with routine gynecological exam - Education provided on SBEs, importance of preventative screenings, current guidelines, high calcium  diet, regular exercise, and multivitamin daily. Labs with PCP.   Cervical cancer screening - Plan: Cytology - PAP( [facility]). CIN-3 greater than 15-20 years ago with laser vaporization, subsequent Paps normal.    Encounter for IUD removal - Plan: IUD removal. Patient tolerated well.   Screening for breast cancer - UTD. Continue annual screenings. Normal breast exam today.  Screening for colon cancer - 2023 colonoscopy. Will repeat at  5-year interval per GI recommendation.   Screening for osteoporosis - Normal bone density in 2019. Will repeat at 5-year interval per recommendation.   Return in about 1 year (around 11/11/2024) for Annual.        Annabella DELENA [facility], 11:00 AM 11/12/2023

## 2023-11-15 LAB — CYTOLOGY - PAP
Comment: NEGATIVE
High risk HPV: POSITIVE — AB

## 2024-01-02 ENCOUNTER — Encounter: Payer: Self-pay | Admitting: Nurse Practitioner

## 2024-01-06 NOTE — Telephone Encounter (Signed)
 Spoke with patient, OV scheduled for 01/13/24 at 11am.   Encounter closed.

## 2024-01-06 NOTE — Telephone Encounter (Signed)
 Please have her schedule an OV to discuss. Thanks.

## 2024-01-10 ENCOUNTER — Encounter: Payer: Self-pay | Admitting: Nurse Practitioner

## 2024-01-13 ENCOUNTER — Ambulatory Visit: Payer: BC Managed Care – PPO | Admitting: Nurse Practitioner

## 2024-01-20 ENCOUNTER — Ambulatory Visit: Admitting: Nurse Practitioner

## 2024-01-20 ENCOUNTER — Encounter: Payer: Self-pay | Admitting: Nurse Practitioner

## 2024-01-20 VITALS — BP 140/96 | HR 86

## 2024-01-20 DIAGNOSIS — N898 Other specified noninflammatory disorders of vagina: Secondary | ICD-10-CM | POA: Diagnosis not present

## 2024-01-20 DIAGNOSIS — N951 Menopausal and female climacteric states: Secondary | ICD-10-CM

## 2024-01-20 DIAGNOSIS — R4189 Other symptoms and signs involving cognitive functions and awareness: Secondary | ICD-10-CM | POA: Diagnosis not present

## 2024-01-20 MED ORDER — PROGESTERONE MICRONIZED 100 MG PO CAPS: 100.0000 mg | ORAL_CAPSULE | Freq: Every evening | ORAL | 0 refills | Status: DC

## 2024-01-20 MED ORDER — ESTRADIOL 0.1 MG/GM VA CREA: 1.0000 | TOPICAL_CREAM | VAGINAL | 0 refills | Status: DC

## 2024-01-20 NOTE — Progress Notes (Signed)
   Acute Office Visit  Subjective:    Patient ID: Teresa Christensen, female    DOB: September 14, 1961, 63 y.o.   MRN: [id number]   HPI 63 y.o. presents today for possible menopausal symptoms. Has experienced multiple symptoms since her early to mid 69s such as brain fog, memory issues, anxiety, difficulty concentrating. At that time she was also going through multiple life stressors, so she assumed this was the cause for her neurological symptoms. Negative workup with neurology. Was started on anti-anxiety meds but does not feel they ever worked (Wellbutrin & Buspar), Adderall as well which has helped with concentration. Denies hot flashes or night sweats. Has some vaginal dryness. Sees cardiology for HTN and HLD. Mother deceased at age 61 from MI, was a smoker. Has been looking into HRT and is wondering if this could be beneficial for her. She does not feel she has any abnormal life stressors or reasons to feel this way as she is in happy relationship, financially stable, likes her new job. Very emotional during visit today.   Patient's last menstrual period was 06/12/2010.    Review of Systems  Constitutional: Negative.   Genitourinary:  Positive for dyspareunia.  Psychiatric/Behavioral:  Positive for decreased concentration, dysphoric mood and sleep disturbance. Negative for agitation, confusion and suicidal ideas. The patient is nervous/anxious.        Objective:    Physical Exam Constitutional:      Appearance: Normal appearance.  Psychiatric:        Attention and Perception: Attention normal.        Mood and Affect: Mood is anxious and depressed.        Speech: Speech normal.        Behavior: Behavior normal.   GU: Not indicated  BP (!) 140/96 (BP Location: Right Arm, Patient Position: Sitting, Cuff Size: Normal)   Pulse 86   LMP 06/12/2010  Wt Readings from Last 3 Encounters:  11/12/23 155 lb (70.3 kg)  10/10/22 158 lb (71.7 kg)  10/09/21 154 lb (69.9 kg)          Assessment & Plan:   Problem List Items Addressed This Visit   None Visit Diagnoses       Menopausal symptoms    -  Primary   Relevant Medications   progesterone (PROMETRIUM) 100 MG capsule     Brain fog         Vaginal dryness       Relevant Medications   estradiol (ESTRACE VAGINAL) 0.1 MG/GM vaginal cream      Plan: Discussed HRT risks and benefits. I am hesitant to start estrogen due to personal history of heart disease. Has elevated BP today. Will start Prometrium nightly, Mag L-threonate nightly, and vaginal estrogen twice weekly.   Return in about 4 weeks (around 02/17/2024) for Med follow up.    Olivia Mackie DNP, 2:04 PM 01/20/2024

## 2024-02-11 ENCOUNTER — Other Ambulatory Visit: Payer: Self-pay | Admitting: Nurse Practitioner

## 2024-02-11 ENCOUNTER — Encounter: Payer: Self-pay | Admitting: Nurse Practitioner

## 2024-02-11 ENCOUNTER — Ambulatory Visit: Admitting: Nurse Practitioner

## 2024-02-11 VITALS — BP 122/84 | HR 80 | Ht 65.75 in | Wt 155.0 lb

## 2024-02-11 DIAGNOSIS — N951 Menopausal and female climacteric states: Secondary | ICD-10-CM

## 2024-02-11 DIAGNOSIS — R4189 Other symptoms and signs involving cognitive functions and awareness: Secondary | ICD-10-CM | POA: Diagnosis not present

## 2024-02-11 DIAGNOSIS — N898 Other specified noninflammatory disorders of vagina: Secondary | ICD-10-CM | POA: Diagnosis not present

## 2024-02-11 NOTE — Telephone Encounter (Signed)
 Med refill request: progesterone 100 mg (requests 90 day RX) Last OV: 01/20/24 consult, menopause symptoms Last AEX: 11/12/23 Next OV: 02/11/24 (1:30 today) Last MMG (if hormonal med) 09/23/23 BI-RADS 0, 10/15/23 MM diag UNI Left Refill authorized: progesterone 100 mg  Please approve or deny as appropriate.

## 2024-02-11 NOTE — Progress Notes (Signed)
   Acute Office Visit  Subjective:    Patient ID: Teresa Christensen, female    DOB: 11/03/1961, 63 y.o.   MRN: [id number]   HPI 63 y.o. presents today forr med follow up. Has been experiencing neurological symptoms since mid 17s that she thinks may be related to menopause to include brain fog, memory issues, anxiety, difficulty concentrating. Also having some vaginal dryness. Negative neurology workup, has tried a couple of anti-anxiety meds, adderall. Menopausal since 2011. Started on Prometrium nightly and vaginal estrogen 01/20/2024. Personal and family history of heart disease, >10 years since menopause, so estrogen avoided. Mag L-threonate recommended for brain fog. She is not sure if she is taking the right mag. Feels her moods have been better. Had one day of crying about a week after starting Prometrium but none since.   Patient's last menstrual period was 06/12/2010.    Review of Systems  Constitutional: Negative.   Psychiatric/Behavioral:  Positive for decreased concentration and dysphoric mood. The patient is nervous/anxious.        Objective:    Physical Exam Constitutional:      Appearance: Normal appearance.     BP 122/84 (BP Location: Left Arm, Patient Position: Sitting, Cuff Size: Normal)   Pulse 80   Ht 5' 5.75" (1.67 m)   Wt 155 lb (70.3 kg)   LMP 06/12/2010   SpO2 90%   BMI 25.21 kg/m  Wt Readings from Last 3 Encounters:  02/11/24 155 lb (70.3 kg)  11/12/23 155 lb (70.3 kg)  10/10/22 158 lb (71.7 kg)        Assessment & Plan:   Problem List Items Addressed This Visit   None Visit Diagnoses       Menopausal symptoms    -  Primary     Brain fog         Vaginal dryness          Plan: Will make sure she is taking the correct mag. Also recommend Vit D + K 2 daily. Some improvement in symptoms. Will continue. May increase Prometrium if symptoms persist after another 1-2 weeks.   Return if symptoms worsen or fail to improve.    Olivia Mackie DNP, 1:57 PM 02/11/2024

## 2024-02-18 ENCOUNTER — Ambulatory Visit: Admitting: Nurse Practitioner

## 2024-02-21 DIAGNOSIS — F909 Attention-deficit hyperactivity disorder, unspecified type: Secondary | ICD-10-CM | POA: Diagnosis not present

## 2024-04-14 DIAGNOSIS — M545 Low back pain, unspecified: Secondary | ICD-10-CM | POA: Diagnosis not present

## 2024-05-18 DIAGNOSIS — M5459 Other low back pain: Secondary | ICD-10-CM | POA: Diagnosis not present

## 2024-05-23 DIAGNOSIS — M545 Low back pain, unspecified: Secondary | ICD-10-CM | POA: Diagnosis not present

## 2024-05-26 DIAGNOSIS — M5459 Other low back pain: Secondary | ICD-10-CM | POA: Diagnosis not present

## 2024-05-26 DIAGNOSIS — M4807 Spinal stenosis, lumbosacral region: Secondary | ICD-10-CM | POA: Diagnosis not present

## 2024-06-04 DIAGNOSIS — M5416 Radiculopathy, lumbar region: Secondary | ICD-10-CM | POA: Diagnosis not present

## 2024-06-04 DIAGNOSIS — M47816 Spondylosis without myelopathy or radiculopathy, lumbar region: Secondary | ICD-10-CM | POA: Diagnosis not present

## 2024-06-09 ENCOUNTER — Ambulatory Visit: Payer: Self-pay | Admitting: Orthopedic Surgery

## 2024-06-09 DIAGNOSIS — M48062 Spinal stenosis, lumbar region with neurogenic claudication: Secondary | ICD-10-CM

## 2024-06-12 ENCOUNTER — Ambulatory Visit: Payer: Self-pay | Admitting: Orthopedic Surgery

## 2024-06-12 NOTE — H&P (View-Only) (Signed)
 Teresa Christensen is an 63 y.o. female.   Chief Complaint: back and right leg pain HPI: Reason for Visit: (normal) review of test results (lumbar MRI) Context: months Location (Lower Extremity): lower back pain ; leg pain on the right, , Severity: pain level 5/10 Associated Symptoms: numbness/tingling (RLE) Medications: Gabapentin ; Ultram ; 800mg  ibuprofen  Notes: Reports medications are helping but make her tired.  Past Medical History:  Diagnosis Date   CIN III (cervical intraepithelial neoplasia grade III) with severe dysplasia    Dyslipidemia, goal LDL below 100 08/2013   Total cholesterol 198, LDL 135, HDL 53 --> 7/'16 TC 160, TG 52, HDL 75. LDL 75!!   Heart disease    Hiatal hernia    High cholesterol    Sciatica     Past Surgical History:  Procedure Laterality Date   COLPOSCOPY     CPET-MET-TEST  November 2014   Effort (RER): 1.18 (>target); peak HR 166 bpm (87% max predicted); HR/Stroke Vol curve discordance (last 5.5 min drop in CO with increasing HR) -> exercise-induced myocardial dysfunction; low risk based on high anaerobic threshold and peak VO2   ENDOMETRIAL ABLATION  11/2003   INTRAUTERINE DEVICE INSERTION  04/12/2016   mirena-Inserted 04-12-16   LASER VAPORIZATION OF CERVIX     TONSILLECTOMY      Family History  Problem Relation Age of Onset   Heart disease Mother        DIED WITH MI AT AGE 67--WAS A SMOKER   Emphysema Father    Hypertension Father    Hypertension Sister    Multiple sclerosis Brother    Breast cancer Maternal Grandmother        Age 71   Social History:  reports that she has never smoked. She has been exposed to tobacco smoke. She has never used smokeless tobacco. She reports current alcohol use. She reports that she does not use drugs.  Allergies:  Allergies  Allergen Reactions   Ciprofloxacin Hcl Swelling   Current meds: buPROPion HCL busPIRone 15 mg tablet dextroamphetamine sulfate 15 mg tablet famotidine  gabapentin  300  mg capsule ibuprofen  800 mg tablet methocarbamoL  500 mg tablet rosuvastatin  10 mg tablet Ultram  50 mg tablet  Review of Systems  Constitutional: Negative.   HENT: Negative.    Eyes: Negative.   Respiratory: Negative.    Cardiovascular: Negative.   Gastrointestinal: Negative.   Endocrine: Negative.   Genitourinary: Negative.   Musculoskeletal:  Positive for back pain and gait problem.  Neurological:  Positive for weakness and numbness.  Psychiatric/Behavioral: Negative.      Last menstrual period 06/12/2010. Physical Exam Constitutional:      Appearance: Normal appearance.  HENT:     Head: Normocephalic and atraumatic.     Right Ear: External ear normal.     Left Ear: External ear normal.     Nose: Nose normal.     Mouth/Throat:     Pharynx: Oropharynx is clear.  Eyes:     Conjunctiva/sclera: Conjunctivae normal.  Cardiovascular:     Rate and Rhythm: Normal rate and regular rhythm.     Pulses: Normal pulses.  Pulmonary:     Effort: Pulmonary effort is normal.  Abdominal:     General: Bowel sounds are normal.  Musculoskeletal:     Cervical back: Normal range of motion.     Comments: Patient is awake, alert, and oriented 3. Well-nourished and well-developed. Slightly antalgic gait. Seated comfortably.  On examination of the lumbar spine, nontender to palpation through the spinous processes.  Tender right paraspinous musculature, right buttock and over the greater trochanter of the hip, nontender on the left. Decreased flexion and extension lumbar spine. Seated straight leg raise on the right reproduces back, buttock and leg pain, negative on the left. Trace EHL weakness on the right, no other lower extremity weakness noted. No groin pain or limitation of motion with rotation of the hips bilaterally. Patellar and Achilles reflexes 2+. No clonus present, negative Babinski. Slightly decreased sensation lateral lower leg on the right. No calf pain or sign of DVT.   Skin:     General: Skin is warm and dry.  Neurological:     Mental Status: She is alert.    Prior x-rays reviewed with disc degeneration L5-S1, no listhesis. Updated 2 view lumbar x-rays ordered, obtained, reviewed today with slight compression of the superior endplate of L1  Lumbar MRI images and report reviewed today by Dr. Baird with chronic superior endplate compression fracture at L1 with 26% right central body height loss without retropulsion. No acute fracture or abnormal marrow replacing lesion. L4-5 with a degenerative listhesis with severe bilateral facet arthrosis. Moderate disc bulge with right foraminal annular fissure and right sided anteromedially directed synovial cyst 8 x 6 x 13 mm. Moderate central stenosis with severe right and moderate left subarticular zone narrowing and medial displacement of descending right L5 and S1 nerve roots. L5-S1 with a grade 1 listhesis, severe right facet arthrosis, disc osteophyte complex, mild stenosis.  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 Discussed avoiding extension and favoring supported flexion. Given her chronic compression of L1 which may have been due to the fall last fall while playing pickle ball, she would likely benefit from a bone density study with her PCP. Continue with ibuprofen , gabapentin , tramadol . Would recommend proceeding with transforaminal epidural L4-5 on the right and facet injection L4-5 on the right. We discussed possible need for decompression if refractory. She was seen in conjunction with Dr. Duwayne today. She will follow-up after injections and call with questions or concerns in the interim.  Refractory to injections, in fact  worse after, mutually agreed to proceed with decompression, questions invited and answered and she desires to proceed.  Plan microlumbar decompression L4-5 right  Darice CHRISTELLA Randy, PA-C for Dr Duwayne 06/12/2024, 9:06 AM

## 2024-06-12 NOTE — H&P (Signed)
 Teresa Christensen is an 63 y.o. female.   Chief Complaint: back and right leg pain HPI: Reason for Visit: (normal) review of test results (lumbar MRI) Context: months Location (Lower Extremity): lower back pain ; leg pain on the right, , Severity: pain level 5/10 Associated Symptoms: numbness/tingling (RLE) Medications: Gabapentin; Ultram; 800mg  ibuprofen Notes: Reports medications are helping but make her tired.  Past Medical History:  Diagnosis Date   CIN III (cervical intraepithelial neoplasia grade III) with severe dysplasia    Dyslipidemia, goal LDL below 100 08/2013   Total cholesterol 198, LDL 135, HDL 53 --> 7/'16 TC 160, TG 52, HDL 75. LDL 75!!   Heart disease    Hiatal hernia    High cholesterol    Sciatica     Past Surgical History:  Procedure Laterality Date   COLPOSCOPY     CPET-MET-TEST  November 2014   Effort (RER): 1.18 (>target); peak HR 166 bpm (87% max predicted); HR/Stroke Vol curve discordance (last 5.5 min drop in CO with increasing HR) -> exercise-induced myocardial dysfunction; low risk based on high anaerobic threshold and peak VO2   ENDOMETRIAL ABLATION  11/2003   INTRAUTERINE DEVICE INSERTION  04/12/2016   mirena-Inserted 04-12-16   LASER VAPORIZATION OF CERVIX     TONSILLECTOMY      Family History  Problem Relation Age of Onset   Heart disease Mother        DIED WITH MI AT AGE 46--WAS A SMOKER   Emphysema Father    Hypertension Father    Hypertension Sister    Multiple sclerosis Brother    Breast cancer Maternal Grandmother        Age 1   Social History:  reports that she has never smoked. She has been exposed to tobacco smoke. She has never used smokeless tobacco. She reports current alcohol use. She reports that she does not use drugs.  Allergies:  Allergies  Allergen Reactions   Ciprofloxacin Hcl Swelling   Current meds: buPROPion HCL busPIRone 15 mg tablet dextroamphetamine sulfate 15 mg tablet famotidine gabapentin 300  mg capsule ibuprofen 800 mg tablet methocarbamoL 500 mg tablet rosuvastatin 10 mg tablet Ultram 50 mg tablet  Review of Systems  Constitutional: Negative.   HENT: Negative.    Eyes: Negative.   Respiratory: Negative.    Cardiovascular: Negative.   Gastrointestinal: Negative.   Endocrine: Negative.   Genitourinary: Negative.   Musculoskeletal:  Positive for back pain and gait problem.  Neurological:  Positive for weakness and numbness.  Psychiatric/Behavioral: Negative.      Last menstrual period 06/12/2010. Physical Exam Constitutional:      Appearance: Normal appearance.  HENT:     Head: Normocephalic and atraumatic.     Right Ear: External ear normal.     Left Ear: External ear normal.     Nose: Nose normal.     Mouth/Throat:     Pharynx: Oropharynx is clear.  Eyes:     Conjunctiva/sclera: Conjunctivae normal.  Cardiovascular:     Rate and Rhythm: Normal rate and regular rhythm.     Pulses: Normal pulses.  Pulmonary:     Effort: Pulmonary effort is normal.  Abdominal:     General: Bowel sounds are normal.  Musculoskeletal:     Cervical back: Normal range of motion.     Comments: Patient is awake, alert, and oriented 3. Well-nourished and well-developed. Slightly antalgic gait. Seated comfortably.  On examination of the lumbar spine, nontender to palpation through the spinous processes.  Tender right paraspinous musculature, right buttock and over the greater trochanter of the hip, nontender on the left. Decreased flexion and extension lumbar spine. Seated straight leg raise on the right reproduces back, buttock and leg pain, negative on the left. Trace EHL weakness on the right, no other lower extremity weakness noted. No groin pain or limitation of motion with rotation of the hips bilaterally. Patellar and Achilles reflexes 2+. No clonus present, negative Babinski. Slightly decreased sensation lateral lower leg on the right. No calf pain or sign of DVT.   Skin:     General: Skin is warm and dry.  Neurological:     Mental Status: She is alert.    Prior x-rays reviewed with disc degeneration L5-S1, no listhesis. Updated 2 view lumbar x-rays ordered, obtained, reviewed today with slight compression of the superior endplate of L1  Lumbar MRI images and report reviewed today by Dr. Baird with chronic superior endplate compression fracture at L1 with 26% right central body height loss without retropulsion. No acute fracture or abnormal marrow replacing lesion. L4-5 with a degenerative listhesis with severe bilateral facet arthrosis. Moderate disc bulge with right foraminal annular fissure and right sided anteromedially directed synovial cyst 8 x 6 x 13 mm. Moderate central stenosis with severe right and moderate left subarticular zone narrowing and medial displacement of descending right L5 and S1 nerve roots. L5-S1 with a grade 1 listhesis, severe right facet arthrosis, disc osteophyte complex, mild stenosis.  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 Discussed avoiding extension and favoring supported flexion. Given her chronic compression of L1 which may have been due to the fall last fall while playing pickle ball, she would likely benefit from a bone density study with her PCP. Continue with ibuprofen, gabapentin, tramadol. Would recommend proceeding with transforaminal epidural L4-5 on the right and facet injection L4-5 on the right. We discussed possible need for decompression if refractory. She was seen in conjunction with Dr. Duwayne today. She will follow-up after injections and call with questions or concerns in the interim.  Refractory to injections, in fact  worse after, mutually agreed to proceed with decompression, questions invited and answered and she desires to proceed.  Plan microlumbar decompression L4-5 right  Teresa CHRISTELLA Randy, PA-C for Dr Duwayne 06/12/2024, 9:06 AM

## 2024-06-18 NOTE — Pre-Procedure Instructions (Signed)
 Surgical Instructions   Your procedure is scheduled on Thursday, August 14th. Report to [facility] Main Entrance A at 08:30 A.M., then check in with the Admitting office. Any questions or running late day of surgery: call [phone]  Questions prior to your surgery date: call [phone], Monday-Friday, 8am-4pm. If you experience any cold or flu symptoms such as cough, fever, chills, shortness of breath, etc. between now and your scheduled surgery, please notify us  at the above number.     Remember:  Do not eat after midnight the night before your surgery  You may drink clear liquids until 07:30 AM the morning of your surgery.   Clear liquids allowed are: Water, Non-Citrus Juices (without pulp), Carbonated Beverages, Clear Tea (no milk, honey, etc.), Black Coffee Only (NO MILK, CREAM OR POWDERED CREAMER of any kind), and Gatorade.  Patient Instructions  The night before surgery:  No food after midnight. ONLY clear liquids after midnight  The day of surgery (if you do NOT have diabetes):  Drink ONE (1) Pre-Surgery Clear Ensure by 07:30 AM the morning of surgery. Drink in one sitting. Do not sip.  This drink was given to you during your hospital  pre-op appointment visit.  Nothing else to drink after completing the  Pre-Surgery Clear Ensure.         If you have questions, please contact your surgeon's office.    Take these medicines the morning of surgery with A SIP OF WATER  rosuvastatin (CRESTOR)    May take these medicines IF NEEDED: gabapentin (NEURONTIN)  traMADol (ULTRAM)    One week prior to surgery, STOP taking any Aspirin (unless otherwise instructed by your surgeon) Aleve, Naproxen, Ibuprofen, Motrin, Advil, Goody's, BC's, all herbal medications, fish oil, and non-prescription vitamins.                     Do NOT Smoke (Tobacco/Vaping) for 24 hours prior to your procedure.  If you use a CPAP at night, you may bring your mask/headgear for your overnight  stay.   You will be asked to remove any contacts, glasses, piercing's, hearing aid's, dentures/partials prior to surgery. Please bring cases for these items if needed.    Patients discharged the day of surgery will not be allowed to drive home, and someone needs to stay with them for 24 hours.  SURGICAL WAITING ROOM VISITATION Patients may have no more than 2 support people in the waiting area - these visitors may rotate.   Pre-op nurse will coordinate an appropriate time for 1 ADULT support person, who may not rotate, to accompany patient in pre-op.  Children under the age of 44 must have an adult with them who is not the patient and must remain in the main waiting area with an adult.  If the patient needs to stay at the hospital during part of their recovery, the visitor guidelines for inpatient rooms apply.  Please refer to the [facility] website for the visitor guidelines for any additional information.   If you received a COVID test during your pre-op visit  it is requested that you wear a mask when out in public, stay away from anyone that may not be feeling well and notify your surgeon if you develop symptoms. If you have been in contact with anyone that has tested positive in the last 10 days please notify you surgeon.      Pre-operative 5 CHG Bathing Instructions   You can play a key role in reducing the  risk of infection after surgery. Your skin needs to be as free of germs as possible. You can reduce the number of germs on your skin by washing with CHG (chlorhexidine gluconate) soap before surgery. CHG is an antiseptic soap that kills germs and continues to kill germs even after washing.   DO NOT use if you have an allergy to chlorhexidine/CHG or antibacterial soaps. If your skin becomes reddened or irritated, stop using the CHG and notify one of our RNs at [phone].   Please shower with the CHG soap starting 4 days before surgery using the following schedule:      Please keep in mind the following:  DO NOT shave, including legs and underarms, starting the day of your first shower.   You may shave your face at any point before/day of surgery.  Place clean sheets on your bed the day you start using CHG soap. Use a clean washcloth (not used since being washed) for each shower. DO NOT sleep with pets once you start using the CHG.   CHG Shower Instructions:  Wash your face and private area with normal soap. If you choose to wash your hair, wash first with your normal shampoo.  After you use shampoo/soap, rinse your hair and body thoroughly to remove shampoo/soap residue.  Turn the water OFF and apply about 3 tablespoons (45 ml) of CHG soap to a CLEAN washcloth.  Apply CHG soap ONLY FROM YOUR NECK DOWN TO YOUR TOES (washing for 3-5 minutes)  DO NOT use CHG soap on face, private areas, open wounds, or sores.  Pay special attention to the area where your surgery is being performed.  If you are having back surgery, having someone wash your back for you may be helpful. Wait 2 minutes after CHG soap is applied, then you may rinse off the CHG soap.  Pat dry with a clean towel  Put on clean clothes/pajamas   If you choose to wear lotion, please use ONLY the CHG-compatible lotions that are listed below.  Additional instructions for the day of surgery: DO NOT APPLY any lotions, deodorants, cologne, or perfumes.   Do not bring valuables to the hospital. [facility] is not responsible for any belongings/valuables. Do not wear nail polish, gel polish, artificial nails, or any other type of covering on natural nails (fingers and toes) Do not wear jewelry or makeup Put on clean/comfortable clothes.  Please brush your teeth.  Ask your nurse before applying any prescription medications to the skin.     CHG Compatible Lotions   Aveeno Moisturizing lotion  Cetaphil Moisturizing Cream  Cetaphil Moisturizing Lotion  Clairol Herbal Essence Moisturizing  Lotion, Dry Skin  Clairol Herbal Essence Moisturizing Lotion, Extra Dry Skin  Clairol Herbal Essence Moisturizing Lotion, Normal Skin  Curel Age Defying Therapeutic Moisturizing Lotion with Alpha Hydroxy  Curel Extreme Care Body Lotion  Curel Soothing Hands Moisturizing Hand Lotion  Curel Therapeutic Moisturizing Cream, Fragrance-Free  Curel Therapeutic Moisturizing Lotion, Fragrance-Free  Curel Therapeutic Moisturizing Lotion, Original Formula  Eucerin Daily Replenishing Lotion  Eucerin Dry Skin Therapy Plus Alpha Hydroxy Crme  Eucerin Dry Skin Therapy Plus Alpha Hydroxy Lotion  Eucerin Original Crme  Eucerin Original Lotion  Eucerin Plus Crme Eucerin Plus Lotion  Eucerin TriLipid Replenishing Lotion  Keri Anti-Bacterial Hand Lotion  Keri Deep Conditioning Original Lotion Dry Skin Formula Softly Scented  Keri Deep Conditioning Original Lotion, Fragrance Free Sensitive Skin Formula  Keri Lotion Fast Absorbing Fragrance Free Sensitive Skin Formula  Keri Lotion Fast  Absorbing Softly Scented Dry Skin Formula  Keri Original Lotion  Keri Skin Renewal Lotion Keri Silky Smooth Lotion  Keri Silky Smooth Sensitive Skin Lotion  Nivea Body Creamy Conditioning Oil  Nivea Body Extra Enriched Lotion  Nivea Body Original Lotion  Nivea Body Sheer Moisturizing Lotion Nivea Crme  Nivea Skin Firming Lotion  NutraDerm 30 Skin Lotion  NutraDerm Skin Lotion  NutraDerm Therapeutic Skin Cream  NutraDerm Therapeutic Skin Lotion  ProShield Protective Hand Cream  Provon moisturizing lotion  Please read over the following fact sheets that you were given.

## 2024-06-19 ENCOUNTER — Other Ambulatory Visit: Payer: Self-pay

## 2024-06-19 ENCOUNTER — Encounter (HOSPITAL_COMMUNITY): Payer: Self-pay

## 2024-06-19 ENCOUNTER — Ambulatory Visit (HOSPITAL_COMMUNITY)
Admission: RE | Admit: 2024-06-19 | Discharge: 2024-06-19 | Disposition: A | Discharge status: Home / Self Care | Source: Ambulatory Visit | Attending: Orthopedic Surgery | Admitting: Orthopedic Surgery

## 2024-06-19 ENCOUNTER — Encounter (HOSPITAL_COMMUNITY)
Admission: RE | Admit: 2024-06-19 | Discharge: 2024-06-19 | Disposition: A | Discharge status: Home / Self Care | Source: Ambulatory Visit | Attending: Specialist | Admitting: Specialist

## 2024-06-19 VITALS — BP 145/84 | HR 74 | Temp 97.9°F | Resp 16 | Ht 65.5 in | Wt 159.5 lb

## 2024-06-19 DIAGNOSIS — Z01818 Encounter for other preprocedural examination: Secondary | ICD-10-CM | POA: Diagnosis not present

## 2024-06-19 DIAGNOSIS — M48061 Spinal stenosis, lumbar region without neurogenic claudication: Secondary | ICD-10-CM | POA: Diagnosis not present

## 2024-06-19 DIAGNOSIS — E785 Hyperlipidemia, unspecified: Secondary | ICD-10-CM | POA: Insufficient documentation

## 2024-06-19 DIAGNOSIS — M48062 Spinal stenosis, lumbar region with neurogenic claudication: Secondary | ICD-10-CM | POA: Insufficient documentation

## 2024-06-19 DIAGNOSIS — M4316 Spondylolisthesis, lumbar region: Secondary | ICD-10-CM | POA: Diagnosis not present

## 2024-06-19 DIAGNOSIS — M51379 Other intervertebral disc degeneration, lumbosacral region without mention of lumbar back pain or lower extremity pain: Secondary | ICD-10-CM | POA: Diagnosis not present

## 2024-06-19 HISTORY — DX: Depression, unspecified: F32.A

## 2024-06-19 HISTORY — DX: Anxiety disorder, unspecified: F41.9

## 2024-06-19 HISTORY — DX: Attention-deficit hyperactivity disorder, unspecified type: F90.9

## 2024-06-19 LAB — CBC
HCT: 40.8 % (ref 36.0–46.0)
Hemoglobin: 13.7 g/dL (ref 12.0–15.0)
MCH: 31.2 pg (ref 26.0–34.0)
MCHC: 33.6 g/dL (ref 30.0–36.0)
MCV: 92.9 fL (ref 80.0–100.0)
Platelets: 228 K/uL (ref 150–400)
RBC: 4.39 MIL/uL (ref 3.87–5.11)
RDW: 11.8 % (ref 11.5–15.5)
WBC: 7.1 K/uL (ref 4.0–10.5)
nRBC: 0 % (ref 0.0–0.2)

## 2024-06-19 LAB — BASIC METABOLIC PANEL WITH GFR
Anion gap: 9 (ref 5–15)
BUN: 17 mg/dL (ref 6–20)
CO2: 28 mmol/L (ref 22–32)
Calcium: 9.5 mg/dL (ref 8.9–10.3)
Chloride: 106 mmol/L (ref 98–111)
Creatinine, Ser: 0.84 mg/dL (ref 0.44–1.00)
GFR, Estimated: >60 mL/min (ref >60)
Glucose, Bld: 110 mg/dL — ABNORMAL HIGH (ref 70–99)
Potassium: 4.2 mmol/L (ref 3.5–5.1)
Sodium: 143 mmol/L (ref 135–145)

## 2024-06-19 LAB — SURGICAL PCR SCREEN
MRSA, PCR: NEGATIVE
Staphylococcus aureus: NEGATIVE

## 2024-06-19 NOTE — Progress Notes (Signed)
 PCP - CHIQUITA MAIZE  Cardiologist -   PPM/ICD - denies Device Orders - n/a Rep Notified - n/a  Chest x-ray - denies EKG - 06-19-24 Stress Test - 09-2013 ECHO -denies Cardiac Cath - denies  Sleep Study - denies CPAP - n/a  DM denies  Blood Thinner Instructions:denies Aspirin Instructions: denies  ERAS Protcol -clear liquids until 7:30 am PRE-SURGERY Ensure   COVID TEST- n/a   Anesthesia review: no  Patient denies shortness of breath, fever, cough and chest pain at PAT appointment   All instructions explained to the patient, with a verbal understanding of the material. Patient agrees to go over the instructions while at home for a better understanding. Patient also instructed to self quarantine after being tested for COVID-19. The opportunity to ask questions was provided.

## 2024-06-24 DIAGNOSIS — M5416 Radiculopathy, lumbar region: Secondary | ICD-10-CM | POA: Diagnosis not present

## 2024-06-25 ENCOUNTER — Ambulatory Visit (HOSPITAL_COMMUNITY)
Admission: RE | Admit: 2024-06-25 | Discharge: 2024-06-26 | Disposition: A | Discharge status: Home / Self Care | Attending: Specialist | Admitting: Specialist

## 2024-06-25 ENCOUNTER — Other Ambulatory Visit: Payer: Self-pay

## 2024-06-25 ENCOUNTER — Encounter (HOSPITAL_COMMUNITY)
Admission: RE | Disposition: A | Discharge status: Home / Self Care | Payer: Self-pay | Source: Home / Self Care | Attending: Specialist

## 2024-06-25 ENCOUNTER — Ambulatory Visit (HOSPITAL_COMMUNITY): Admitting: Anesthesiology

## 2024-06-25 ENCOUNTER — Encounter (HOSPITAL_COMMUNITY): Payer: Self-pay | Admitting: Specialist

## 2024-06-25 ENCOUNTER — Ambulatory Visit (HOSPITAL_COMMUNITY)

## 2024-06-25 DIAGNOSIS — M5416 Radiculopathy, lumbar region: Secondary | ICD-10-CM | POA: Diagnosis not present

## 2024-06-25 DIAGNOSIS — F419 Anxiety disorder, unspecified: Secondary | ICD-10-CM | POA: Diagnosis not present

## 2024-06-25 DIAGNOSIS — M48061 Spinal stenosis, lumbar region without neurogenic claudication: Secondary | ICD-10-CM | POA: Diagnosis not present

## 2024-06-25 DIAGNOSIS — K219 Gastro-esophageal reflux disease without esophagitis: Secondary | ICD-10-CM | POA: Insufficient documentation

## 2024-06-25 DIAGNOSIS — Z9682 Presence of neurostimulator: Secondary | ICD-10-CM | POA: Diagnosis not present

## 2024-06-25 DIAGNOSIS — K449 Diaphragmatic hernia without obstruction or gangrene: Secondary | ICD-10-CM | POA: Diagnosis not present

## 2024-06-25 DIAGNOSIS — F32A Depression, unspecified: Secondary | ICD-10-CM | POA: Insufficient documentation

## 2024-06-25 DIAGNOSIS — M5116 Intervertebral disc disorders with radiculopathy, lumbar region: Secondary | ICD-10-CM | POA: Diagnosis not present

## 2024-06-25 DIAGNOSIS — M419 Scoliosis, unspecified: Secondary | ICD-10-CM | POA: Insufficient documentation

## 2024-06-25 DIAGNOSIS — M7138 Other bursal cyst, other site: Secondary | ICD-10-CM | POA: Diagnosis not present

## 2024-06-25 DIAGNOSIS — M4156 Other secondary scoliosis, lumbar region: Secondary | ICD-10-CM | POA: Diagnosis not present

## 2024-06-25 DIAGNOSIS — M7130 Other bursal cyst, unspecified site: Secondary | ICD-10-CM | POA: Diagnosis not present

## 2024-06-25 SURGERY — LAMINECTOMY, SPINE, LUMBAR, WITH EXTRADURAL LESION EXCISION
Anesthesia: General | Laterality: Right

## 2024-06-25 MED ORDER — CHLORHEXIDINE GLUCONATE 0.12 % MT SOLN
15.0000 mL | Freq: Once | OROMUCOSAL | Status: AC
Start: 2024-06-25 — End: 2024-06-25
  Administered 2024-06-25: 15 mL via OROMUCOSAL
  Filled 2024-06-25: qty 15

## 2024-06-25 MED ORDER — PHENYLEPHRINE 80 MCG/ML (10ML) SYRINGE FOR IV PUSH (FOR BLOOD PRESSURE SUPPORT)
PREFILLED_SYRINGE | INTRAVENOUS | Status: DC | PRN
  Administered 2024-06-25: 80 ug via INTRAVENOUS

## 2024-06-25 MED ORDER — DROPERIDOL 2.5 MG/ML IJ SOLN: 0.6250 mg | Freq: Once | INTRAMUSCULAR | Status: DC | PRN

## 2024-06-25 MED ORDER — 0.9 % SODIUM CHLORIDE (POUR BTL) OPTIME
TOPICAL | Status: DC | PRN
  Administered 2024-06-25: 1000 mL

## 2024-06-25 MED ORDER — KCL IN DEXTROSE-NACL 20-5-0.9 MEQ/L-%-% IV SOLN
INTRAVENOUS | Status: DC
  Filled 2024-06-25: qty 1000

## 2024-06-25 MED ORDER — DEXMEDETOMIDINE HCL IN NACL 80 MCG/20ML IV SOLN
INTRAVENOUS | Status: AC
  Filled 2024-06-25: qty 20

## 2024-06-25 MED ORDER — PROPOFOL 10 MG/ML IV BOLUS
INTRAVENOUS | Status: DC | PRN
  Administered 2024-06-25: 130 mg via INTRAVENOUS

## 2024-06-25 MED ORDER — ACETAMINOPHEN 650 MG RE SUPP: 650.0000 mg | RECTAL | Status: DC | PRN

## 2024-06-25 MED ORDER — ROSUVASTATIN CALCIUM 5 MG PO TABS
10.0000 mg | ORAL_TABLET | Freq: Every morning | ORAL | Status: DC
  Administered 2024-06-26: 10 mg via ORAL
  Filled 2024-06-25: qty 2

## 2024-06-25 MED ORDER — PROPOFOL 500 MG/50ML IV EMUL
INTRAVENOUS | Status: DC | PRN
  Administered 2024-06-25: 125 ug/kg/min via INTRAVENOUS

## 2024-06-25 MED ORDER — DEXMEDETOMIDINE HCL IN NACL 80 MCG/20ML IV SOLN
INTRAVENOUS | Status: DC | PRN
Start: 2024-06-25 — End: 2024-06-25
  Administered 2024-06-25: 12 ug via INTRAVENOUS

## 2024-06-25 MED ORDER — MENTHOL 3 MG MT LOZG: 1.0000 | LOZENGE | OROMUCOSAL | Status: DC | PRN

## 2024-06-25 MED ORDER — BISACODYL 5 MG PO TBEC: 5.0000 mg | DELAYED_RELEASE_TABLET | Freq: Every day | ORAL | Status: DC | PRN

## 2024-06-25 MED ORDER — ONDANSETRON HCL 4 MG/2ML IJ SOLN: 4.0000 mg | Freq: Four times a day (QID) | INTRAMUSCULAR | Status: DC | PRN

## 2024-06-25 MED ORDER — HYDROMORPHONE HCL 1 MG/ML IJ SOLN: 0.2500 mg | INTRAMUSCULAR | Status: DC | PRN

## 2024-06-25 MED ORDER — PHENYLEPHRINE 80 MCG/ML (10ML) SYRINGE FOR IV PUSH (FOR BLOOD PRESSURE SUPPORT)
PREFILLED_SYRINGE | INTRAVENOUS | Status: AC
  Filled 2024-06-25: qty 10

## 2024-06-25 MED ORDER — ALUM & MAG HYDROXIDE-SIMETH 200-200-20 MG/5ML PO SUSP: 30.0000 mL | Freq: Four times a day (QID) | ORAL | Status: DC | PRN

## 2024-06-25 MED ORDER — BUPIVACAINE-EPINEPHRINE 0.5% -1:200000 IJ SOLN
INTRAMUSCULAR | Status: DC | PRN
  Administered 2024-06-25: 5 mL

## 2024-06-25 MED ORDER — PROPOFOL 1000 MG/100ML IV EMUL
INTRAVENOUS | Status: AC
  Filled 2024-06-25: qty 100

## 2024-06-25 MED ORDER — KETAMINE HCL 10 MG/ML IJ SOLN
INTRAMUSCULAR | Status: DC | PRN
  Administered 2024-06-25: 30 mg via INTRAVENOUS

## 2024-06-25 MED ORDER — PHENOL 1.4 % MT LIQD: 1.0000 | OROMUCOSAL | Status: DC | PRN

## 2024-06-25 MED ORDER — LIDOCAINE 2% (20 MG/ML) 5 ML SYRINGE
INTRAMUSCULAR | Status: DC | PRN
  Administered 2024-06-25: 70 mg via INTRAVENOUS

## 2024-06-25 MED ORDER — KETAMINE HCL 50 MG/5ML IJ SOSY
PREFILLED_SYRINGE | INTRAMUSCULAR | Status: AC
Start: 2024-06-25 — End: 2024-06-25
  Filled 2024-06-25: qty 5

## 2024-06-25 MED ORDER — FENTANYL CITRATE (PF) 250 MCG/5ML IJ SOLN
INTRAMUSCULAR | Status: DC | PRN
  Administered 2024-06-25: 25 ug via INTRAVENOUS
  Administered 2024-06-25 (×3): 50 ug via INTRAVENOUS

## 2024-06-25 MED ORDER — POLYETHYLENE GLYCOL 3350 17 G PO PACK: 17.0000 g | PACK | Freq: Every day | ORAL | Status: DC | PRN

## 2024-06-25 MED ORDER — RISAQUAD PO CAPS
1.0000 | ORAL_CAPSULE | Freq: Every day | ORAL | Status: DC
  Administered 2024-06-25: 1 via ORAL
  Filled 2024-06-25: qty 1

## 2024-06-25 MED ORDER — ACETAMINOPHEN 325 MG PO TABS: 650.0000 mg | ORAL_TABLET | ORAL | Status: DC | PRN

## 2024-06-25 MED ORDER — DEXAMETHASONE SODIUM PHOSPHATE 10 MG/ML IJ SOLN
INTRAMUSCULAR | Status: DC | PRN
  Administered 2024-06-25: 10 mg via INTRAVENOUS

## 2024-06-25 MED ORDER — CEFAZOLIN SODIUM-DEXTROSE 2-4 GM/100ML-% IV SOLN
2.0000 g | Freq: Three times a day (TID) | INTRAVENOUS | Status: AC
  Administered 2024-06-25: 2 g via INTRAVENOUS
  Filled 2024-06-25 (×2): qty 100

## 2024-06-25 MED ORDER — THROMBIN 20000 UNITS EX SOLR
CUTANEOUS | Status: AC
  Filled 2024-06-25: qty 20000

## 2024-06-25 MED ORDER — TRANEXAMIC ACID-NACL 1000-0.7 MG/100ML-% IV SOLN
1000.0000 mg | INTRAVENOUS | Status: AC
  Administered 2024-06-25: 1000 mg via INTRAVENOUS
  Filled 2024-06-25: qty 100

## 2024-06-25 MED ORDER — ROCURONIUM BROMIDE 10 MG/ML (PF) SYRINGE
PREFILLED_SYRINGE | INTRAVENOUS | Status: AC
  Filled 2024-06-25: qty 10

## 2024-06-25 MED ORDER — HYDROMORPHONE HCL 1 MG/ML IJ SOLN
INTRAMUSCULAR | Status: AC
  Filled 2024-06-25: qty 0.5

## 2024-06-25 MED ORDER — PROGESTERONE MICRONIZED 100 MG PO CAPS
100.0000 mg | ORAL_CAPSULE | Freq: Every day | ORAL | Status: DC
  Administered 2024-06-25: 100 mg via ORAL
  Filled 2024-06-25 (×3): qty 1

## 2024-06-25 MED ORDER — SUGAMMADEX SODIUM 200 MG/2ML IV SOLN
INTRAVENOUS | Status: DC | PRN
  Administered 2024-06-25: 200 mg via INTRAVENOUS

## 2024-06-25 MED ORDER — THROMBIN 20000 UNITS EX SOLR
CUTANEOUS | Status: DC | PRN
  Administered 2024-06-25: 20 mL via TOPICAL

## 2024-06-25 MED ORDER — LACTATED RINGERS IV SOLN: INTRAVENOUS | Status: DC

## 2024-06-25 MED ORDER — ROCURONIUM BROMIDE 10 MG/ML (PF) SYRINGE
PREFILLED_SYRINGE | INTRAVENOUS | Status: DC | PRN
  Administered 2024-06-25: 30 mg via INTRAVENOUS
  Administered 2024-06-25: 50 mg via INTRAVENOUS
  Administered 2024-06-25: 10 mg via INTRAVENOUS

## 2024-06-25 MED ORDER — METHOCARBAMOL 500 MG PO TABS: 500.0000 mg | ORAL_TABLET | Freq: Four times a day (QID) | ORAL | Status: DC | PRN

## 2024-06-25 MED ORDER — ORAL CARE MOUTH RINSE: 15.0000 mL | Freq: Once | OROMUCOSAL | Status: AC

## 2024-06-25 MED ORDER — VITAMIN D 25 MCG (1000 UNIT) PO TABS
1000.0000 [IU] | ORAL_TABLET | Freq: Every day | ORAL | Status: DC
  Administered 2024-06-25: 1000 [IU] via ORAL
  Filled 2024-06-25: qty 1

## 2024-06-25 MED ORDER — PHENYLEPHRINE HCL-NACL 20-0.9 MG/250ML-% IV SOLN
INTRAVENOUS | Status: DC | PRN
  Administered 2024-06-25: 25 ug/min via INTRAVENOUS

## 2024-06-25 MED ORDER — PROPOFOL 10 MG/ML IV BOLUS
INTRAVENOUS | Status: AC
  Filled 2024-06-25: qty 20

## 2024-06-25 MED ORDER — METHOCARBAMOL 1000 MG/10ML IJ SOLN: 500.0000 mg | Freq: Four times a day (QID) | INTRAMUSCULAR | Status: DC | PRN

## 2024-06-25 MED ORDER — MIDAZOLAM HCL 2 MG/2ML IJ SOLN
INTRAMUSCULAR | Status: AC
  Filled 2024-06-25: qty 2

## 2024-06-25 MED ORDER — GABAPENTIN 300 MG PO CAPS: 300.0000 mg | ORAL_CAPSULE | Freq: Three times a day (TID) | ORAL | Status: DC | PRN

## 2024-06-25 MED ORDER — OXYCODONE HCL 5 MG PO TABS
10.0000 mg | ORAL_TABLET | ORAL | Status: DC | PRN
  Administered 2024-06-25 – 2024-06-26 (×3): 10 mg via ORAL
  Filled 2024-06-25 (×4): qty 2

## 2024-06-25 MED ORDER — TRAMADOL HCL 50 MG PO TABS: 50.0000 mg | ORAL_TABLET | Freq: Four times a day (QID) | ORAL | Status: DC | PRN

## 2024-06-25 MED ORDER — MAGNESIUM CITRATE PO SOLN: 1.0000 | Freq: Once | ORAL | Status: DC | PRN

## 2024-06-25 MED ORDER — DEXAMETHASONE SODIUM PHOSPHATE 10 MG/ML IJ SOLN
INTRAMUSCULAR | Status: AC
  Filled 2024-06-25: qty 1

## 2024-06-25 MED ORDER — SUCCINYLCHOLINE CHLORIDE 200 MG/10ML IV SOSY
PREFILLED_SYRINGE | INTRAVENOUS | Status: AC
  Filled 2024-06-25: qty 10

## 2024-06-25 MED ORDER — GLYCOPYRROLATE PF 0.2 MG/ML IJ SOSY
PREFILLED_SYRINGE | INTRAMUSCULAR | Status: AC
  Filled 2024-06-25: qty 2

## 2024-06-25 MED ORDER — ONDANSETRON HCL 4 MG PO TABS: 4.0000 mg | ORAL_TABLET | Freq: Four times a day (QID) | ORAL | Status: DC | PRN

## 2024-06-25 MED ORDER — ACETAMINOPHEN 10 MG/ML IV SOLN
1000.0000 mg | INTRAVENOUS | Status: AC
  Administered 2024-06-25: 1000 mg via INTRAVENOUS
  Filled 2024-06-25: qty 100

## 2024-06-25 MED ORDER — CEFAZOLIN SODIUM-DEXTROSE 2-4 GM/100ML-% IV SOLN
2.0000 g | INTRAVENOUS | Status: AC
  Administered 2024-06-25: 2 g via INTRAVENOUS
  Filled 2024-06-25: qty 100

## 2024-06-25 MED ORDER — DOCUSATE SODIUM 100 MG PO CAPS
100.0000 mg | ORAL_CAPSULE | Freq: Two times a day (BID) | ORAL | Status: DC
  Administered 2024-06-25 (×2): 100 mg via ORAL
  Filled 2024-06-25 (×2): qty 1

## 2024-06-25 MED ORDER — ONDANSETRON HCL 4 MG/2ML IJ SOLN
INTRAMUSCULAR | Status: AC
  Filled 2024-06-25: qty 2

## 2024-06-25 MED ORDER — LIDOCAINE 2% (20 MG/ML) 5 ML SYRINGE
INTRAMUSCULAR | Status: AC
  Filled 2024-06-25: qty 5

## 2024-06-25 MED ORDER — OXYCODONE HCL 5 MG PO TABS
5.0000 mg | ORAL_TABLET | ORAL | Status: DC | PRN
  Administered 2024-06-25: 5 mg via ORAL

## 2024-06-25 MED ORDER — ONDANSETRON HCL 4 MG/2ML IJ SOLN
INTRAMUSCULAR | Status: DC | PRN
  Administered 2024-06-25: 4 mg via INTRAVENOUS

## 2024-06-25 MED ORDER — FENTANYL CITRATE (PF) 250 MCG/5ML IJ SOLN
INTRAMUSCULAR | Status: AC
  Filled 2024-06-25: qty 5

## 2024-06-25 MED ORDER — FAMOTIDINE 20 MG PO TABS
40.0000 mg | ORAL_TABLET | Freq: Every day | ORAL | Status: DC
  Administered 2024-06-25: 40 mg via ORAL
  Filled 2024-06-25: qty 2

## 2024-06-25 MED ORDER — BUPIVACAINE-EPINEPHRINE (PF) 0.5% -1:200000 IJ SOLN
INTRAMUSCULAR | Status: AC
  Filled 2024-06-25: qty 30

## 2024-06-25 MED ORDER — MIDAZOLAM HCL 2 MG/2ML IJ SOLN
INTRAMUSCULAR | Status: DC | PRN
  Administered 2024-06-25: 2 mg via INTRAVENOUS

## 2024-06-25 SURGICAL SUPPLY — 50 items
BAG COUNTER SPONGE SURGICOUNT (BAG) ×1 IMPLANT
BAG DECANTER FOR FLEXI CONT (MISCELLANEOUS) IMPLANT
BAND RUBBER #18 3X1/16 STRL (MISCELLANEOUS) ×2 IMPLANT
BUR EGG ELITE 5.0 (BURR) IMPLANT
BUR RND DIAMOND ELITE 4.0 (BURR) IMPLANT
CLEANER TIP ELECTROSURG 2X2 (MISCELLANEOUS) ×1 IMPLANT
CNTNR URN SCR LID CUP LEK RST (MISCELLANEOUS) ×1 IMPLANT
DRAPE LAPAROTOMY 100X72X124 (DRAPES) ×1 IMPLANT
DRAPE MICROSCOPE SLANT 54X150 (MISCELLANEOUS) ×1 IMPLANT
DRAPE SHEET LG 3/4 BI-LAMINATE (DRAPES) ×1 IMPLANT
DRAPE SURG 17X11 SM STRL (DRAPES) ×1 IMPLANT
DRAPE UTILITY XL STRL (DRAPES) ×1 IMPLANT
DRSG AQUACEL AG ADV 3.5X 4 (GAUZE/BANDAGES/DRESSINGS) IMPLANT
DRSG AQUACEL AG ADV 3.5X 6 (GAUZE/BANDAGES/DRESSINGS) IMPLANT
DRSG TELFA 3X8 NADH STRL (GAUZE/BANDAGES/DRESSINGS) IMPLANT
DURAPREP 26ML APPLICATOR (WOUND CARE) ×1 IMPLANT
DURASEAL SPINE SEALANT 3ML (MISCELLANEOUS) IMPLANT
ELECTRODE BLDE 4.0 EZ CLN MEGD (MISCELLANEOUS) IMPLANT
ELECTRODE REM PT RTRN 9FT ADLT (ELECTROSURGICAL) ×1 IMPLANT
GLOVE BIOGEL PI IND STRL 7.5 (GLOVE) ×1 IMPLANT
GLOVE SURG SS PI 7.0 STRL IVOR (GLOVE) ×1 IMPLANT
GLOVE SURG SS PI 8.0 STRL IVOR (GLOVE) ×2 IMPLANT
GOWN STRL REUS W/ TWL LRG LVL3 (GOWN DISPOSABLE) ×1 IMPLANT
GOWN STRL REUS W/ TWL XL LVL3 (GOWN DISPOSABLE) ×1 IMPLANT
IV CATH 14GX2 1/4 (CATHETERS) ×1 IMPLANT
KIT BASIN OR (CUSTOM PROCEDURE TRAY) ×1 IMPLANT
NDL 22X1.5 STRL (OR ONLY) (MISCELLANEOUS) ×1 IMPLANT
NDL SPNL 18GX3.5 QUINCKE PK (NEEDLE) ×2 IMPLANT
NEEDLE 22X1.5 STRL (OR ONLY) (MISCELLANEOUS) ×1 IMPLANT
NEEDLE SPNL 18GX3.5 QUINCKE PK (NEEDLE) ×2 IMPLANT
PACK LAMINECTOMY NEURO (CUSTOM PROCEDURE TRAY) ×1 IMPLANT
PATTIES SURGICAL .75X.75 (GAUZE/BANDAGES/DRESSINGS) ×1 IMPLANT
SOLUTION PRONTOSAN WOUND 350ML (IRRIGATION / IRRIGATOR) IMPLANT
SPONGE SURGIFOAM ABS GEL 100 (HEMOSTASIS) ×1 IMPLANT
SPONGE T-LAP 4X18 ~~LOC~~+RFID (SPONGE) IMPLANT
STAPLER VISISTAT (STAPLE) IMPLANT
STRIP CLOSURE SKIN 1/2X4 (GAUZE/BANDAGES/DRESSINGS) ×1 IMPLANT
SUT NURALON 4 0 TR CR/8 (SUTURE) IMPLANT
SUT PROLENE 3 0 PS 2 (SUTURE) IMPLANT
SUT VIC AB 1 CT1 27XBRD ANTBC (SUTURE) IMPLANT
SUT VIC AB 1-0 CT2 27 (SUTURE) IMPLANT
SUT VIC AB 2-0 CT1 TAPERPNT 27 (SUTURE) IMPLANT
SUT VIC AB 2-0 CT2 27 (SUTURE) IMPLANT
SYR 3ML LL SCALE MARK (SYRINGE) ×1 IMPLANT
TOWEL GREEN STERILE (TOWEL DISPOSABLE) ×1 IMPLANT
TOWEL GREEN STERILE FF (TOWEL DISPOSABLE) ×1 IMPLANT
TRAY FOLEY MTR SLVR 14FR STAT (SET/KITS/TRAYS/PACK) IMPLANT
TRAY FOLEY MTR SLVR 16FR STAT (SET/KITS/TRAYS/PACK) ×1 IMPLANT
WIPE CHG 2% 2PK PREOPERATIVE (MISCELLANEOUS) ×1 IMPLANT
YANKAUER SUCT BULB TIP NO VENT (SUCTIONS) ×1 IMPLANT

## 2024-06-25 NOTE — Transfer of Care (Signed)
 Immediate Anesthesia Transfer of Care Note  Patient: Golda Zavalza Holdren  Procedure(s) Performed: CENTRAL LAMINECTOMY LUMBAR SPINE LUMBAR FOUR-FIVE RIGHT,  LUMBAR, WITH EXTRADURAL LESION EXCISION (Right)  Patient Location: PACU  Anesthesia Type:General  Level of Consciousness: awake and drowsy  Airway & Oxygen Therapy: Patient Spontanous Breathing and Patient connected to face mask oxygen  Post-op Assessment: Report given to RN, Post -op Vital signs reviewed and stable, and Patient moving all extremities X 4  Post vital signs: Reviewed and stable  Last Vitals:  Vitals Value Taken Time  BP 109/63 06/25/24 13:30  Temp 36.7 C 06/25/24 13:15  Pulse 64 06/25/24 13:30  Resp 9 06/25/24 13:30  SpO2 97 % 06/25/24 13:30  Vitals shown include unfiled device data.  Last Pain:  Vitals:   06/25/24 1315  TempSrc:   PainSc: Asleep      Patients Stated Pain Goal: 1 (06/25/24 0859)  Complications: No notable events documented.

## 2024-06-25 NOTE — Evaluation (Signed)
 Occupational Therapy Evaluation Patient Details Name: Teresa Christensen MRN: [id number] DOB: 1961/08/06 Today's Date: 06/25/2024   History of Present Illness   Pt is a 63 yo female presenting [facility] on 8/14 with spinal stenosis and synovial cyst L4-L5. S/p elective L4-L5 Laminectomy. PMHx: sciatica, heart disease     Clinical Impressions Pt admitted for above, PTA pt was ind with her Adls/iADLs and mobility, driving as well. She lives with her fiance on a farm house and has assist from him 24/7 if needed. Pt currently presenting with impaired balance, suspect recently ambulating post op and medications are also playing a factor. Educated pt on post op back precautions and compensatory strategies for ADLs and tranfers(including car). Pt return demonstrated understanding of techniques and transfers with setup/supervision assist, but did need cues to maintain back precautions throughout session. She is mildly unsteady and needs CGA to safely ambulate with OT. OT to continue following pt acutely to progress pt as able. No post acute OT anticipate post acute.      If plan is discharge home, recommend the following:   Assistance with cooking/housework;Assist for transportation     Functional Status Assessment   Patient has had a recent decline in their functional status and demonstrates the ability to make significant improvements in function in a reasonable and predictable amount of time.     Equipment Recommendations   Tub/shower seat     Recommendations for Other Services         Precautions/Restrictions   Precautions Precautions: Fall;Back Precaution Booklet Issued: Yes (comment) Recall of Precautions/Restrictions: Impaired Precaution/Restrictions Comments: cues needed to maintain back precautions during activities Required Braces or Orthoses:  (no brace needed) Restrictions Weight Bearing Restrictions Per Provider Order: No     Mobility Bed  Mobility Overal bed mobility: Needs Assistance Bed Mobility: Rolling, Sidelying to Sit Rolling: Supervision Sidelying to sit: Supervision       General bed mobility comments: educated pt on log roll technique, min cues for new learning.    Transfers Overall transfer level: Needs assistance Equipment used: None Transfers: Sit to/from Stand Sit to Stand: Contact guard assist           General transfer comment: posterior biased sway.      Balance Overall balance assessment: Mild deficits observed, not formally tested                                         ADL either performed or assessed with clinical judgement   ADL Overall ADL's : Needs assistance/impaired Eating/Feeding: Independent;Sitting   Grooming: Sitting;Set up Grooming Details (indicate cue type and reason): educated pt on use of cups for oral care at sink Upper Body Bathing: Sitting;Set up   Lower Body Bathing: Set up;Sitting/lateral leans Lower Body Bathing Details (indicate cue type and reason): pt able to use figure four position to complete LBB Upper Body Dressing : Modified independent;Sitting Upper Body Dressing Details (indicate cue type and reason): donned shirt and long sleeve shirt without challenge Lower Body Dressing: Contact guard assist;Sit to/from stand Lower Body Dressing Details (indicate cue type and reason): to don pants, mild posterior biased posture. able to use figure four and doff/don socks and shoes. Toilet Transfer: Contact guard assist;Ambulation   Toileting- Architect and Hygiene: Contact guard assist;Sit to/from stand       Functional mobility during ADLs: Contact guard assist  Vision   Vision Assessment?: No apparent visual deficits     Perception         Praxis         Pertinent Vitals/Pain Pain Assessment Pain Assessment: 0-10 Pain Score: 7  Pain Location: back, op site Pain Descriptors / Indicators: Discomfort, Aching,  Sore Pain Intervention(s): Limited activity within patient's tolerance, Monitored during session, Repositioned     Extremity/Trunk Assessment Upper Extremity Assessment Upper Extremity Assessment: Overall WFL for tasks assessed   Lower Extremity Assessment Lower Extremity Assessment: Generalized weakness (R>L)   Cervical / Trunk Assessment Cervical / Trunk Assessment: Back Surgery   Communication Communication Communication: No apparent difficulties   Cognition Arousal: Alert Behavior During Therapy: WFL for tasks assessed/performed Cognition: Cognition impaired     Awareness: Online awareness impaired, Intellectual awareness intact       OT - Cognition Comments: Pt with decreased awareness of her precautions despite education, needing consistent cueing. She reports still being under influence of medications which may be impacting her cognitively.                 Following commands: Intact       Cueing  General Comments   Cueing Techniques: Verbal cues;Tactile cues  fiance present during session, discussed benefits of shower seat and possible use of reacher going forward.   Exercises     Shoulder Instructions      Home Living Family/patient expects to be discharged to:: Private residence Living Arrangements: Spouse/significant other Available Help at Discharge: Family;Available 24 hours/day Type of Home: House Home Access: Ramped entrance     Home Layout: One level     Bathroom Shower/Tub: Producer, television/film/video:  (pt unsure)     Home Equipment: Cane - single point;Grab bars - toilet;Grab bars - tub/shower;Wheelchair - manual;Hand held shower head          Prior Functioning/Environment Prior Level of Function : Independent/Modified Independent;Driving             Mobility Comments: ind no AD ADLs Comments: ind/mod i    OT Problem List: Pain;Impaired balance (sitting and/or standing);Decreased knowledge of  precautions;Decreased strength   OT Treatment/Interventions: Self-care/ADL training;Patient/family education;Therapeutic exercise;Balance training;Therapeutic activities;DME and/or AE instruction      OT Goals(Current goals can be found in the care plan section)   Acute Rehab OT Goals Patient Stated Goal: To hopefully go home OT Goal Formulation: With patient Time For Goal Achievement: 07/09/24 Potential to Achieve Goals: Good ADL Goals Pt Will Perform Grooming: with modified independence;standing Pt Will Perform Lower Body Bathing: with modified independence;sitting/lateral leans Pt Will Perform Lower Body Dressing: sit to/from stand;with modified independence Additional ADL Goal #2: Pt will verbailze understanding of 3/3 back precautions without cueing in prep for safe functional activity.   OT Frequency:  Min 2X/week    Co-evaluation              AM-PAC OT 6 Clicks Daily Activity     Outcome Measure Help from another person eating meals?: None Help from another person taking care of personal grooming?: A Little Help from another person toileting, which includes using toliet, bedpan, or urinal?: A Little Help from another person bathing (including washing, rinsing, drying)?: A Little Help from another person to put on and taking off regular upper body clothing?: None Help from another person to put on and taking off regular lower body clothing?: A Little 6 Click Score: 20   End of Session Equipment Utilized During  Treatment: Gait belt Nurse Communication: Mobility status  Activity Tolerance: Patient tolerated treatment well Patient left: in bed;with call bell/phone within reach;Other (comment) (Pt left in care of providing PT; safely followed them as they returned to EOB sitting to get her BP)  OT Visit Diagnosis: Unsteadiness on feet (R26.81);Pain;Muscle weakness (generalized) (M62.81) Pain - part of body:  (back)                Time: 8453-8386 OT Time  Calculation (min): 27 min Charges:  OT General Charges $OT Visit: 1 Visit OT Evaluation $OT Eval Moderate Complexity: 1 Mod OT Treatments $Self Care/Home Management : 8-22 mins  06/25/2024  AB, OTR/L  Acute Rehabilitation Services  Office: [phone]   Curtistine JONETTA Das 06/25/2024, 6:00 PM

## 2024-06-25 NOTE — Brief Op Note (Signed)
 06/25/2024  9:45 AM  PATIENT:  Teresa Christensen  63 y.o. female  PRE-OPERATIVE DIAGNOSIS:  Spinal stenosis, synovial cyst L4-5 Right  POST-OPERATIVE DIAGNOSIS:  * No post-op diagnosis entered *  PROCEDURE:  Procedure(s) with comments: LAMINECTOMY, SPINE, LUMBAR, WITH EXTRADURAL LESION EXCISION (Right) - Microlumbar decompression L4-5 right  SURGEON:  Surgeons and Role:    DEWAINE Duwayne Purchase, MD - Primary  PHYSICIAN ASSISTANT:   ASSISTANTS: Bissell   ANESTHESIA:   general  EBL:  50   BLOOD ADMINISTERED:none  DRAINS: none   LOCAL MEDICATIONS USED:  MARCAINE      SPECIMEN:  No Specimen  DISPOSITION OF SPECIMEN:  PATHOLOGY  COUNTS:  YES  TOURNIQUET:  * No tourniquets in log *  DICTATION: .Other Dictation: Dictation Number [id number]  PLAN OF CARE: Admit for overnight observation  PATIENT DISPOSITION:  PACU - hemodynamically stable.   Delay start of Pharmacological VTE agent (>24hrs) due to surgical blood loss or risk of bleeding: yes

## 2024-06-25 NOTE — Discharge Instructions (Addendum)
 Walk As Tolerated utilizing back precautions.  No bending, twisting, or lifting.  No driving for 2 weeks.   Aquacel dressing may remain in place until follow up. May shower with aquacel dressing in place. If the dressing peels off or becomes saturated, you may remove aquacel dressing and place gauze and tape dressing which should be kept clean and dry and changed daily. Do not remove steri-strips if they are present. See Dr. Duwayne in office in 10 to 14 days. Begin taking aspirin 81mg  per day starting 4 days after your surgery if not allergic to aspirin or on another blood thinner. Walk daily even outside. Use a cane or walker only if necessary. Avoid sitting on soft sofas. May resume ibuprofen  5 days post-op

## 2024-06-25 NOTE — Anesthesia Procedure Notes (Signed)
 Procedure Name: Intubation Date/Time: 06/25/2024 10:43 AM  Performed by: Mollie Olivia SAUNDERS, CRNAPre-anesthesia Checklist: Patient identified, Emergency Drugs available, Suction available and Patient being monitored Patient Re-evaluated:Patient Re-evaluated prior to induction Oxygen Delivery Method: Circle system utilized Preoxygenation: Pre-oxygenation with 100% oxygen Induction Type: IV induction Ventilation: Mask ventilation without difficulty Laryngoscope Size: Mac and 3 Grade View: Grade II Tube type: Oral Tube size: 7.0 mm Number of attempts: 1 Airway Equipment and Method: Stylet Placement Confirmation: ETT inserted through vocal cords under direct vision, positive ETCO2 and breath sounds checked- equal and bilateral Secured at: 22 cm Tube secured with: Tape Dental Injury: Teeth and Oropharynx as per pre-operative assessment  Difficulty Due To: Difficult Airway- due to anterior larynx

## 2024-06-25 NOTE — Interval H&P Note (Signed)
 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  The various methods of treatment have been discussed with the patient and family. After consideration of risks, benefits and other options for treatment, the patient has consented to  Procedure(s) with comments: LAMINECTOMY, SPINE, LUMBAR, WITH EXTRADURAL LESION EXCISION (Right) - Microlumbar decompression L4-5 right as a surgical intervention.  The patient's history has been reviewed, patient examined, no change in status, stable for surgery.  I have reviewed the patient's chart and labs.  Questions were answered to the patient's satisfaction.   Patient was seen yesterday.  Increasing pain and increasing weakness on the right also pain going down the left side.  After reviewing the MRI she does have stenosis at L4-5 as well as a cyst.  Will proceed with a central decompression and excision of the synovial cyst at L4-5.  Discussed the risk and benefits including bleeding infection damage nearby structures, no change in symptoms worsening symptoms need for fusion in the future DVT anesthetic complication etc.    Reyes JAYSON Billing

## 2024-06-25 NOTE — Evaluation (Addendum)
 Physical Therapy Evaluation Patient Details Name: Teresa Christensen MRN: [id number] DOB: 11-15-1960 Today's Date: 06/25/2024  History of Present Illness  Pt is a 63 yo female presenting to [facility] on 8/14 with spinal stenosis and synovial cyst L4-L5. S/p elective L4-L5 Laminectomy. PMHx: sciatica, heart disease  Clinical Impression  PTA, pt was ambulating with no AD for short distances due to low back pain. Pt was limited in today's session by feeling dizzy at rest and with movement with increased low back pain. Pt reported feeling drunk ever since surgery. Required CGA/supervision for bed mobility and CGA to stand with no AD. Pt was swaying posteriorly when taking BP with awareness and ability to correct with ankle strategy. When ambulating in a straight line, pt had slightly antalgic gait pattern with CGA for safety. When turning, pt reported an increase in dizziness with x2 slight losses of balance requiring MinA to correct. Anticipate pt will progress well with continued mobility and reduction of symptoms. Pt has 24/7 level of assist available at home. Recommending post-acute OP PT to address functional impairments. Pt would benefit from acute skilled PT with current functional limitations listed below (see PT Problem List). Acute PT to follow.  Sitting BP- 121/75 (91), 73 BPM Standing BP- 122/69 (82), 84 BPM Standing for 3 Min BP- 123/68 (85), 85 BPM         If plan is discharge home, recommend the following: A little help with walking and/or transfers;Assist for transportation;Help with stairs or ramp for entrance;A little help with bathing/dressing/bathroom   Can travel by private vehicle    Yes    Equipment Recommendations Other (comment) (TBD)     Functional Status Assessment Patient has had a recent decline in their functional status and demonstrates the ability to make significant improvements in function in a reasonable and predictable amount of time.     Precautions /  Restrictions Precautions Precautions: Fall;Back Precaution Booklet Issued: Yes (comment) Recall of Precautions/Restrictions: Impaired Precaution/Restrictions Comments: able to recall 2/3 precautions, cues to avoid twisting during session Required Braces or Orthoses:  (no brace per orders) Restrictions Weight Bearing Restrictions Per Provider Order: No      Mobility  Bed Mobility Overal bed mobility: Needs Assistance Bed Mobility: Rolling, Sit to Sidelying Rolling: Supervision    Sit to sidelying: Contact guard assist, Used rails General bed mobility comments: cues for log roll technique with CGA. Increased difficulty lifting R LE into bed    Transfers Overall transfer level: Needs assistance Equipment used: None Transfers: Sit to/from Stand Sit to Stand: Contact guard assist    General transfer comment: CGA for safety, slight posterior swaying when taking BP readings with pt able to correct with cueing. Pt had increased difficulty with second STS repetition and turned body to face the bed in order to push up from foot board (not twisting)    Ambulation/Gait Ambulation/Gait assistance: Contact guard assist, Min assist Gait Distance (Feet): 100 Feet (x100, x200) Assistive device: None Gait Pattern/deviations: Antalgic, Narrow base of support, Decreased stride length, Step-through pattern, Decreased dorsiflexion - right, Decreased dorsiflexion - left Gait velocity: decr     General Gait Details: Antalgic gait pattern with slow and cautious steps. Reported feeling dizzy when turning (x2) with slight LOB requiring MinA to correct.     Balance Overall balance assessment: Mild deficits observed, not formally tested       Pertinent Vitals/Pain Pain Assessment Pain Assessment: Faces Faces Pain Scale: Hurts even more Pain Location: back, op site Pain Descriptors /  Indicators: Discomfort, Aching, Sore Pain Intervention(s): Limited activity within patient's tolerance,  Monitored during session, Repositioned, Patient requesting pain meds-RN notified    Home Living Family/patient expects to be discharged to:: Private residence Living Arrangements: Spouse/significant other Available Help at Discharge: Family;Available 24 hours/day Type of Home: House Home Access: Ramped entrance    Home Layout: One level Home Equipment: Cane - single point;Grab bars - toilet;Grab bars - tub/shower;Wheelchair - manual;Hand held shower head      Prior Function Prior Level of Function : Independent/Modified Independent;Driving    Mobility Comments: ind no AD ADLs Comments: ind/mod i     Extremity/Trunk Assessment   Upper Extremity Assessment Upper Extremity Assessment: Defer to OT evaluation    Lower Extremity Assessment Lower Extremity Assessment: Generalized weakness (R>L)    Cervical / Trunk Assessment Cervical / Trunk Assessment: Back Surgery  Communication   Communication Communication: No apparent difficulties    Cognition Arousal: Alert Behavior During Therapy: WFL for tasks assessed/performed   PT - Cognitive impairments: No apparent impairments      PT - Cognition Comments: Reported feeling drunk and loopy during session. Tearful during session due to feeling unsteady and dizzy Following commands: Intact       Cueing Cueing Techniques: Verbal cues, Tactile cues     General Comments General comments (skin integrity, edema, etc.): Fiance present and supportive during session     PT Assessment Patient needs continued PT services  PT Problem List Decreased strength;Decreased activity tolerance;Decreased balance;Decreased mobility;Decreased knowledge of precautions;Pain       PT Treatment Interventions DME instruction;Gait training;Functional mobility training;Therapeutic activities;Therapeutic exercise;Balance training;Neuromuscular re-education;Patient/family education    PT Goals (Current goals can be found in the Care Plan section)   Acute Rehab PT Goals Patient Stated Goal: to be able to walk and not feel dizzy PT Goal Formulation: With patient/family Time For Goal Achievement: 07/09/24 Potential to Achieve Goals: Good    Frequency Min 5X/week        AM-PAC PT 6 Clicks Mobility  Outcome Measure Help needed turning from your back to your side while in a flat bed without using bedrails?: A Little Help needed moving from lying on your back to sitting on the side of a flat bed without using bedrails?: A Little Help needed moving to and from a bed to a chair (including a wheelchair)?: A Little Help needed standing up from a chair using your arms (e.g., wheelchair or bedside chair)?: A Little Help needed to walk in hospital room?: A Little Help needed climbing 3-5 steps with a railing? : A Little 6 Click Score: 18    End of Session Equipment Utilized During Treatment: Gait belt Activity Tolerance: Patient tolerated treatment well Patient left: in bed;with call bell/phone within reach;with family/visitor present Nurse Communication: Mobility status PT Visit Diagnosis: Unsteadiness on feet (R26.81);Other abnormalities of gait and mobility (R26.89);Muscle weakness (generalized) (M62.81);Pain Pain - part of body:  (back)    Time: 8381-8359 PT Time Calculation (min) (ACUTE ONLY): 22 min   Charges:   PT Evaluation $PT Eval Low Complexity: 1 Low   PT General Charges $$ ACUTE PT VISIT: 1 Visit       Kate ORN, PT, DPT Secure Chat Preferred  Rehab Office [phone]   Kate BRAVO Wendolyn 06/25/2024, 5:14 PM

## 2024-06-25 NOTE — Anesthesia Postprocedure Evaluation (Signed)
 Anesthesia Post Note  Patient: Shresta Risden Malenfant  Procedure(s) Performed: CENTRAL LAMINECTOMY LUMBAR SPINE LUMBAR FOUR-FIVE RIGHT,  LUMBAR, WITH EXTRADURAL LESION EXCISION (Right)     Patient location during evaluation: PACU Anesthesia Type: General Level of consciousness: awake and alert Pain management: pain level controlled Vital Signs Assessment: post-procedure vital signs reviewed and stable Respiratory status: spontaneous breathing, nonlabored ventilation, respiratory function stable and patient connected to nasal cannula oxygen Cardiovascular status: blood pressure returned to baseline and stable Postop Assessment: no apparent nausea or vomiting Anesthetic complications: no   No notable events documented.  Last Vitals:  Vitals:   06/25/24 1618 06/25/24 2002  BP: 121/75 124/71  Pulse: 78 72  Resp:  18  Temp:  36.8 C  SpO2: 99% 100%    Last Pain:  Vitals:   06/25/24 2039  TempSrc:   PainSc: 7                  Thom JONELLE Peoples

## 2024-06-25 NOTE — Anesthesia Preprocedure Evaluation (Addendum)
 Anesthesia Evaluation  Patient identified by MRN, date of birth, ID band Patient awake    Reviewed: Allergy & Precautions, NPO status , Patient's Chart, lab work & pertinent test results  Airway Mallampati: II  TM Distance: >3 FB Neck ROM: Full    Dental no notable dental hx. (+) Dental Advisory Given, Teeth Intact   Pulmonary neg pulmonary ROS, neg recent URI   Pulmonary exam normal breath sounds clear to auscultation       Cardiovascular negative cardio ROS Normal cardiovascular exam Rhythm:Regular Rate:Normal     Neuro/Psych  PSYCHIATRIC DISORDERS Anxiety Depression     Neuromuscular disease    GI/Hepatic Neg liver ROS, hiatal hernia,GERD  Medicated and Controlled,,  Endo/Other  negative endocrine ROS    Renal/GU negative Renal ROS     Musculoskeletal negative musculoskeletal ROS (+)    Abdominal   Peds  Hematology negative hematology ROS (+) DOES NOT REFUSE BLOOD PRODUCTS  Anesthesia Other Findings   Reproductive/Obstetrics                              Anesthesia Physical Anesthesia Plan  ASA: 2  Anesthesia Plan: General   Post-op Pain Management: Tylenol  PO (pre-op)*   Induction: Intravenous  PONV Risk Score and Plan: 4 or greater and Ondansetron , Dexamethasone , Treatment may vary due to age or medical condition and Midazolam   Airway Management Planned: Oral ETT  Additional Equipment:   Intra-op Plan:   Post-operative Plan: Extubation in OR  Informed Consent: I have reviewed the patients History and Physical, chart, labs and discussed the procedure including the risks, benefits and alternatives for the proposed anesthesia with the patient or authorized representative who has indicated his/her understanding and acceptance.     Dental advisory given  Plan Discussed with: CRNA  Anesthesia Plan Comments:          Anesthesia Quick Evaluation

## 2024-06-26 DIAGNOSIS — K449 Diaphragmatic hernia without obstruction or gangrene: Secondary | ICD-10-CM | POA: Diagnosis not present

## 2024-06-26 DIAGNOSIS — K219 Gastro-esophageal reflux disease without esophagitis: Secondary | ICD-10-CM | POA: Diagnosis not present

## 2024-06-26 DIAGNOSIS — F32A Depression, unspecified: Secondary | ICD-10-CM | POA: Diagnosis not present

## 2024-06-26 DIAGNOSIS — M7138 Other bursal cyst, other site: Secondary | ICD-10-CM | POA: Diagnosis not present

## 2024-06-26 DIAGNOSIS — M48061 Spinal stenosis, lumbar region without neurogenic claudication: Secondary | ICD-10-CM | POA: Diagnosis not present

## 2024-06-26 DIAGNOSIS — M419 Scoliosis, unspecified: Secondary | ICD-10-CM | POA: Diagnosis not present

## 2024-06-26 DIAGNOSIS — M7130 Other bursal cyst, unspecified site: Secondary | ICD-10-CM | POA: Diagnosis not present

## 2024-06-26 DIAGNOSIS — M5416 Radiculopathy, lumbar region: Secondary | ICD-10-CM | POA: Diagnosis not present

## 2024-06-26 DIAGNOSIS — F419 Anxiety disorder, unspecified: Secondary | ICD-10-CM | POA: Diagnosis not present

## 2024-06-26 MED ORDER — TRAMADOL HCL 50 MG PO TABS: 50.0000 mg | ORAL_TABLET | Freq: Four times a day (QID) | ORAL | 0 refills | Status: DC | PRN

## 2024-06-26 MED ORDER — POLYETHYLENE GLYCOL 3350 17 G PO PACK: 17.0000 g | PACK | Freq: Every day | ORAL | 0 refills | Status: DC

## 2024-06-26 MED ORDER — METHOCARBAMOL 500 MG PO TABS: 500.0000 mg | ORAL_TABLET | Freq: Three times a day (TID) | ORAL | 1 refills | Status: DC | PRN

## 2024-06-26 MED ORDER — DOCUSATE SODIUM 100 MG PO CAPS: 100.0000 mg | ORAL_CAPSULE | Freq: Two times a day (BID) | ORAL | 2 refills | Status: DC

## 2024-06-26 MED ORDER — IBUPROFEN 800 MG PO TABS: 800.0000 mg | ORAL_TABLET | Freq: Three times a day (TID) | ORAL | 0 refills | Status: DC

## 2024-06-26 NOTE — Discharge Summary (Signed)
 Physician Discharge Summary   Patient ID: Teresa Christensen MRN: [id number] DOB/AGE: [birth year] 63 y.o.  Admit date: 06/25/2024 Discharge date: 06/26/2024  Primary Diagnosis:   Spinal stenosis, synovial cyst Lumbar four-five Right  Admission Diagnoses:  Past Medical History:  Diagnosis Date   ADHD (attention deficit hyperactivity disorder)    Anxiety    CIN III (cervical intraepithelial neoplasia grade III) with severe dysplasia    Depression    Dyslipidemia, goal LDL below 100 08/2013   Total cholesterol 198, LDL 135, HDL 53 --> 7/'16 TC 160, TG 52, HDL 75. LDL 75!!   Heart disease    Hiatal hernia    High cholesterol    Sciatica    Discharge Diagnoses:   Principal Problem:   Spinal stenosis of lumbar region with radiculopathy  Procedure:  Procedure(s) (LRB): CENTRAL LAMINECTOMY LUMBAR SPINE LUMBAR FOUR-FIVE RIGHT,  LUMBAR, WITH EXTRADURAL LESION EXCISION (Right)   Consults: None  HPI:  See H&P    Laboratory Data: Hospital Outpatient Visit on 06/19/2024  Component Date Value Ref Range Status   MRSA, PCR 06/19/2024 NEGATIVE  NEGATIVE Final   Staphylococcus aureus 06/19/2024 NEGATIVE  NEGATIVE Final   Comment: (NOTE) The Xpert SA Assay (FDA approved for NASAL specimens in patients 46 years of age and older), is one component of a comprehensive surveillance program. It is not intended to diagnose infection nor to guide or monitor treatment. Performed at [facility], [address] [place], [place] [address]    Sodium 06/19/2024 143  135 - 145 mmol/L Final   Potassium 06/19/2024 4.2  3.5 - 5.1 mmol/L Final   Chloride 06/19/2024 106  98 - 111 mmol/L Final   CO2 06/19/2024 28  22 - 32 mmol/L Final   Glucose, Bld 06/19/2024 110 (H)  70 - 99 mg/dL Final   Glucose reference range applies only to samples taken after fasting for at least 8 hours.   BUN 06/19/2024 17  6 - 20 mg/dL Final   Creatinine, Ser 06/19/2024 0.84  0.44 - 1.00 mg/dL Final    Calcium  06/19/2024 9.5  8.9 - 10.3 mg/dL Final   GFR, Estimated 06/19/2024 >60  >60 mL/min Final   Comment: (NOTE) Calculated using the CKD-EPI Creatinine Equation (2021)    Anion gap 06/19/2024 9  5 - 15 Final   Performed at [facility], [address], [place], [place] [address]   WBC 06/19/2024 7.1  4.0 - 10.5 K/uL Final   RBC 06/19/2024 4.39  3.87 - 5.11 MIL/uL Final   Hemoglobin 06/19/2024 13.7  12.0 - 15.0 g/dL Final   HCT 91/91/7974 40.8  36.0 - 46.0 % Final   MCV 06/19/2024 92.9  80.0 - 100.0 fL Final   MCH 06/19/2024 31.2  26.0 - 34.0 pg Final   MCHC 06/19/2024 33.6  30.0 - 36.0 g/dL Final   RDW 91/91/7974 11.8  11.5 - 15.5 % Final   Platelets 06/19/2024 228  150 - 400 K/uL Final   nRBC 06/19/2024 0.0  0.0 - 0.2 % Final   Performed at [facility], [address], [place], [place] [address]   No results for input(s): HGB in the last 72 hours. No results for input(s): WBC, RBC, HCT, PLT in the last 72 hours. No results for input(s): NA, K, CL, CO2, BUN, CREATININE, GLUCOSE, CALCIUM  in the last 72 hours. No results for input(s): LABPT, INR in the last 72 hours.  X-Rays:DG Lumbar Spine 2-3 Views Result Date: 06/25/2024  CLINICAL DATA:  Intraop imaging for localization. EXAM: DG LUMBAR SPINE 2-3V COMPARISON:  06/19/2024 FINDINGS: Three intraoperative sagittal images of the lumbosacral spine. Initial image demonstrates 2 metallic surgical instruments over the posterior soft tissues with tips just posterior to the L4 and L5 spinous processes. Subsequent images demonstrate a surgical metallic instrument with tip along the posterior border of the L4 vertebral body. Exam is otherwise unchanged. IMPRESSION: Intraoperative localization with final image demonstrating surgical instrument tip along the posterior border of the L4 vertebral body. Electronically Signed   By: Toribio Agreste M.D.   On: 06/25/2024 13:55   DG Lumbar Spine 2-3 Views Result  Date: 06/20/2024 CLINICAL DATA:  Herniated nucleus polyposis, lumbar.  Preop. EXAM: LUMBAR SPINE - 2-3 VIEW COMPARISON:  None Available. FINDINGS: There are 5 non-rib-bearing lumbar vertebra. Grade 1 anterolisthesis of L4 on L5, trace anterolisthesis of L5 on S1. Normal vertebral body heights. No fracture or compression deformity. Moderate L4-L5 and L5-S1 facet hypertrophy. Mild disc space narrowing and spurring at L5-S1. IMPRESSION: 1. Moderate L4-L5 and L5-S1 facet hypertrophy. 2. Mild degenerative disc disease at L5-S1. 3. Grade 1 anterolisthesis of L4 on L5 and L5 on S1. Electronically Signed   By: Andrea Gasman M.D.   On: 06/20/2024 17:49    EKG: Orders placed or performed during the hospital encounter of 06/19/24   EKG 12-LEAD   EKG 12-LEAD     Hospital Course: Patient was admitted to [facility] and taken to the OR and underwent the above state procedure without complications.  Patient tolerated the procedure well and was later transferred to the recovery room and then to the orthopaedic floor for postoperative care.  They were given PO and IV analgesics for pain control following their surgery.  They were given 24 hours of postoperative antibiotics.   PT was consulted postop to assist with mobility and transfers.  The patient was allowed to be WBAT with therapy and was taught back precautions. Discharge planning was consulted to help with postop disposition and equipment needs.  Patient had a good night on the evening of surgery and started to get up OOB with therapy on day one. Patient was seen in rounds and was ready to go home on day one.  They were given discharge instructions and dressing directions.  They were instructed on when to follow up in the office with Dr. Duwayne.   Diet: Regular diet Activity:WBAT, Lspine precautions Follow-up:in 10-14 days Disposition - Home Discharged Condition: good   Discharge Instructions     Call MD / Call 911   Complete by: As directed     If you experience chest pain or shortness of breath, CALL 911 and be transported to the hospital emergency room.  If you develope a fever above 101 F, pus (white drainage) or increased drainage or redness at the wound, or calf pain, call your surgeon's office.   Constipation Prevention   Complete by: As directed    Drink plenty of fluids.  Prune juice may be helpful.  You may use a stool softener, such as Colace (over the counter) 100 mg twice a day.  Use MiraLax  (over the counter) for constipation as needed.   Diet - low sodium heart healthy   Complete by: As directed    Increase activity slowly as tolerated   Complete by: As directed    Post-operative opioid taper instructions:   Complete by: As directed    POST-OPERATIVE OPIOID TAPER INSTRUCTIONS: It is important to wean off  of your opioid medication as soon as possible. If you do not need pain medication after your surgery it is ok to stop day one. Opioids include: Codeine, Hydrocodone(Norco, Vicodin), Oxycodone (Percocet, oxycontin ) and hydromorphone  amongst others.  Long term and even short term use of opiods can cause: Increased pain response Dependence Constipation Depression Respiratory depression And more.  Withdrawal symptoms can include Flu like symptoms Nausea, vomiting And more Techniques to manage these symptoms Hydrate well Eat regular healthy meals Stay active Use relaxation techniques(deep breathing, meditating, yoga) Do Not substitute Alcohol to help with tapering If you have been on opioids for less than two weeks and do not have pain than it is ok to stop all together.  Plan to wean off of opioids This plan should start within one week post op of your joint replacement. Maintain the same interval or time between taking each dose and first decrease the dose.  Cut the total daily intake of opioids by one tablet each day Next start to increase the time between doses. The last dose that should be eliminated is  the evening dose.         Allergies as of 06/26/2024       Reactions   Ciprofloxacin Hcl Swelling        Medication List     TAKE these medications    docusate sodium  100 MG capsule Commonly known as: Colace Take 1 capsule (100 mg total) by mouth 2 (two) times daily.   famotidine  40 MG tablet Commonly known as: PEPCID  Take 40 mg by mouth at bedtime.   gabapentin  300 MG capsule Commonly known as: NEURONTIN  Take 300 mg by mouth 3 (three) times daily as needed (pain.).   ibuprofen  800 MG tablet Commonly known as: ADVIL  Take 1 tablet (800 mg total) by mouth in the morning, at noon, and at bedtime. Resume 5 days post-op as needed What changed: additional instructions   K2 PO Take 1 tablet by mouth in the morning.   MAGNESIUM  EXTRA STRENGTH PO Take 1 capsule by mouth in the morning. Magnesium  L Threonate   methocarbamol  500 MG tablet Commonly known as: ROBAXIN  Take 1 tablet (500 mg total) by mouth every 8 (eight) hours as needed for muscle spasms.   polyethylene glycol 17 g packet Commonly known as: MIRALAX  / GLYCOLAX  Take 17 g by mouth daily.   progesterone  100 MG capsule Commonly known as: PROMETRIUM  TAKE 1 CAPSULE BY MOUTH AT BEDTIME.   rosuvastatin  10 MG tablet Commonly known as: CRESTOR  Take 10 mg by mouth in the morning.   traMADol  50 MG tablet Commonly known as: ULTRAM  Take 1 tablet (50 mg total) by mouth every 6 (six) hours as needed (pain.).   VITAMIN D3 PO Take 1 tablet by mouth in the morning.        Follow-up Information     Duwayne Purchase, MD Follow up in 2 week(s).   Specialty: Orthopedic Surgery Contact information: [address] [place] [address] [place] [address] [phone]                 Signed: Elizeth Weinrich, PA-C Orthopaedic Surgery 06/26/2024, 8:30 AM

## 2024-06-26 NOTE — Progress Notes (Signed)
 Physical Therapy Treatment and Discharge Patient Details Name: Teresa Christensen MRN: [id number] DOB: Dec 23, 1960 Today's Date: 06/26/2024   History of Present Illness Pt is a 63 yo female presenting to [facility] on 8/14 with spinal stenosis and synovial cyst L4-L5. S/p elective L4-L5 Laminectomy. PMHx: sciatica, heart disease    PT Comments  Pt met her physical therapy goals during her inpatient stay. Pt reports resolution of dizziness and overall is mobilizing very well. Pt ambulating 400 ft with no assistive device and negotiated steps independently. Reviewed activity recommendations, gentle TA contractions for core activation, car transfer technique, spinal precautions for comfort. Pt will benefit from follow up OPPT to maximize functional independence.    If plan is discharge home, recommend the following: Assist for transportation   Can travel by private vehicle        Equipment Recommendations  None recommended by PT    Recommendations for Other Services       Precautions / Restrictions Precautions Precautions: Back Precaution Booklet Issued: Yes (comment) Recall of Precautions/Restrictions: Intact Restrictions Weight Bearing Restrictions Per Provider Order: No     Mobility  Bed Mobility Overal bed mobility: Modified Independent             General bed mobility comments: Verbal cues for log roll technique, HOB flat, no rail    Transfers Overall transfer level: Independent Equipment used: None                    Ambulation/Gait Ambulation/Gait assistance: Independent Gait Distance (Feet): 400 Feet Assistive device: None Gait Pattern/deviations: WFL(Within Functional Limits)           Stairs Stairs: Yes Stairs assistance: Modified independent (Device/Increase time) Stair Management: One rail Left Number of Stairs: 3 General stair comments: Verbal cues for step by step technique for comfort   Wheelchair Mobility     Tilt Bed     Modified Rankin (Stroke Patients Only)       Balance Overall balance assessment: No apparent balance deficits (not formally assessed)                                          Communication Communication Communication: No apparent difficulties  Cognition Arousal: Alert Behavior During Therapy: WFL for tasks assessed/performed   PT - Cognitive impairments: No apparent impairments                         Following commands: Intact      Cueing Cueing Techniques: Verbal cues  Exercises Other Exercises Other Exercises: Supine: TA contraction x 5 (3 s hold)    General Comments        Pertinent Vitals/Pain Pain Assessment Pain Assessment: Faces Faces Pain Scale: Hurts a little bit Pain Location: back, op site Pain Descriptors / Indicators: Sore Pain Intervention(s): Monitored during session    Home Living                          Prior Function            PT Goals (current goals can now be found in the care plan section) Acute Rehab PT Goals Patient Stated Goal: return to yoga Potential to Achieve Goals: Good Progress towards PT goals: Goals met/education completed, patient discharged from PT    Frequency    Min 5X/week  PT Plan      Co-evaluation              AM-PAC PT 6 Clicks Mobility   Outcome Measure  Help needed turning from your back to your side while in a flat bed without using bedrails?: None Help needed moving from lying on your back to sitting on the side of a flat bed without using bedrails?: None Help needed moving to and from a bed to a chair (including a wheelchair)?: None Help needed standing up from a chair using your arms (e.g., wheelchair or bedside chair)?: None Help needed to walk in hospital room?: None Help needed climbing 3-5 steps with a railing? : None 6 Click Score: 24    End of Session   Activity Tolerance: Patient tolerated treatment well Patient left: in bed;with  call bell/phone within reach Nurse Communication: Mobility status PT Visit Diagnosis: Unsteadiness on feet (R26.81);Other abnormalities of gait and mobility (R26.89);Muscle weakness (generalized) (M62.81);Pain Pain - part of body:  (back)     Time: 9154-9093 PT Time Calculation (min) (ACUTE ONLY): 21 min  Charges:    $Therapeutic Activity: 8-22 mins PT General Charges $$ ACUTE PT VISIT: 1 Visit                     [name], PT, DPT Acute Rehabilitation Services Office [phone]    Teresa Christensen 06/26/2024, 9:11 AM

## 2024-06-26 NOTE — Progress Notes (Signed)
 Patient alert and oriented, patient ambulate, void. Surgical site clean and dry no sign of infection. D/c instructions explain and given, all questions answered.

## 2024-06-26 NOTE — Op Note (Unsigned)
 [name], Teresa Christensen MEDICAL RECORD NO: [id number] ACCOUNT NO: [id number] DATE OF BIRTH: [birth year] FACILITY: [facility] LOCATION: [facility] PHYSICIAN: Reyes KYM Billing, MD  Operative Report   DATE OF PROCEDURE: 06/25/2024  PREOPERATIVE DIAGNOSES: 1. Synovial cyst L4-L5 right. 2. Spinal stenosis L4-L5 bilaterally. 3. Scoliosis.  POSTOPERATIVE DIAGNOSES: 1. Synovial cyst L4-L5 right. 2. Spinal stenosis L4-L5 bilaterally. 3. Scoliosis.  PROCEDURE PERFORMED: 1. Central laminectomy at L4-L5 with excision of synovial cyst L4-L5, right. 2. Bilateral lateral recess decompression L4-L5 with foraminotomies at L5.  ANESTHESIA: General.  ASSISTANT: Teresa Bissell, PA  Specimen: Synovial cyst to Pathology  HISTORY: This is a 63 year old female with severe right lower extremity radicular pain, neural tension signs, EHL and dorsiflexion weakness secondary to a large synovial cyst on top of underlying spinal stenosis. She has symptoms beginning in her left  lower extremity as well. Initially, we planned a decompression at L4-L5 on the right only. However, with her recent symptoms, we discussed bilateral decompression and excision of synovial cyst. She had a minimal spondylolisthesis at L4-L5, had no back  pain. Has a junctional scoliosis. No instability in flexion and extension. We discussed a decompression preserving the facets. Risks and benefits including bleeding, infection, damage to neurovascular structures, no change in symptoms, worsening  symptoms, DVT, PE, anesthetic complications, etc., possible need for fusion in the future and recurrent cyst.  DESCRIPTION OF PROCEDURE: With the patient in supine position, after induction of adequate general anesthesia, 2 g of Kefzol  placed prone on the Wilson frame. All bony prominences were well padded. The lumbar region was prepped and draped in the usual  sterile fashion. Two 18-gauge spinal needles were utilized to localize the L4-L5 interspace  confirmed with x-ray. Incision was made from the spinous process of L4 to L5. Subcutaneous tissue was dissected. Electrocautery was utilized to achieve  hemostasis. Dorsal lumbar fascia divided in line with skin incision. Paraspinous muscle elevated from the lamina of L4-L5 bilaterally. McCulloch retractor was placed. The operating microscope was draped and brought in the surgical field. Confirmatory  radiographs obtained with the Kochers on spinous process of L4. Leksell rongeur was utilized to remove a portion of the spinous process of L4 and L5. Hypertrophic ligamentum was noted. I skeletonized the lamina of L4 and of L5. I performed a  hemilaminotomy of the caudad edge of L4 with a 2 and 3 mm Kerrison to point cephalad to detach the ligamentum flavum. First, ligamentum flavum was removed from the interspace at L4-L5 on the left first detaching it from the cephalad edge of L4. I entered  the epidural space in the midline protecting the thecal sac with a Woodson and a neural patty. I then protected the neural elements and sequentially removed the ligamentum flavum on the left and decompressing it out to the lateral recess to the medial  border of the pedicle performing a foraminotomy of L5, preserving the facet. No facet was removed on the left. Severe lateral recess stenosis was noted. Then on the right, I removed ligamentum flavum centrally. I then entered the facet on the right, the  inferior aspect of the facet. I skeletonized the cephalad edge of L5. I then performed a hemilaminotomy of the caudal edge of lamina of L5. I removed approximately 10% of the medial inferior process of L5, exposing the inferomedial portion of L4. This  exposed the superior articulating process of L5. I then used a combination of the [place] and a micro curette to gain access to the epidural space. I then  used a Penfield to protect the L5 root, decompressing the lateral recess to the medial border of  the pedicle, performed  a foraminotomy of L5 with a 2 mm Kerrison, again protecting the L5 root at all times. There was lateral recess stenosis noted. Gently mobilized the L5 root medially. There was no disk herniation noted and epidural venous plexus was  cauterized. I then continued cephalad identifying the large synovial cyst emanating from the superior aspect of the facet at L4-L5 on the right. I gently developed a plane between the thecal sac and the cyst itself and then continued cephalad to a point  above the cyst, which was at the foramen of L4-L5. I protected the neural elements with the [facility]. I removed the synovial cyst with a 2 and 3 mm Kerrison. I saved this specimen. This had been compressing the thecal sac into the lateral recess. This  decompressed that quite well. A foraminotomy at L4 was performed. Bipolar electrocautery was utilized to achieve hemostasis. I used the Eunice out the foramen of L4 and above to the pedicle of L4 sweeping beneath the thecal sac and out the foramen. No  residual cystic material noted. The cyst contents were gelatinous and consistent with a synovial cyst. Again, a very minimal portion of the facet was removed approximately 10% of the superior articulating process as well. The nerve root of L5 was found  to be erythematous and edematous, but well decompressed following the removal of the ligamentum flavum and the synovial cyst. I copiously irrigated the wound. I performed a Valsalva to 40 with no evidence of CSF leaking. Bone wax was placed on the  cancellous surfaces. A Woodson probe passed freely out the foramen bilaterally at L5 and at L4. I meticulously achieved hemostasis. A thrombin -soaked Gelfoam was placed in the lateral recesses and then retrieved. There was a small area in the axilla of  the L5 root distally that was slightly either attenuated thecal sac or an epidural vein. I placed a 2 mm2 thrombin -soaked Gelfoam over top of this area. I then removed the [facility]  retractor. Good restoration of the thecal sac was noted prior to this. I  copiously irrigated the wound once again. Meticulously achieved hemostasis. I then closed the dorsal lumbar fascia with 1 Vicryl in interrupted figure-of-eight sutures. I then closed the subcu with 2-0 and skin with subcuticular Prolene. Sterile  dressing applied, placed supine on the hospital bed, extubated without difficulty, and transported to the recovery room in satisfactory condition. The patient tolerated the procedure well and no complications.  Assistant, Teresa Christensen, [place], was used throughout the case for patient positioning, general intermittent neural traction, suction, and closure.  BLOOD LOSS: 50 mL    PAA D: 06/25/2024 5:41:44 pm T: 06/26/2024 3:54:00 am  JOB: [id number]/ [id number]

## 2024-06-26 NOTE — Progress Notes (Signed)
 Subjective: 1 Day Post-Op Procedure(s) (LRB): CENTRAL LAMINECTOMY LUMBAR SPINE LUMBAR FOUR-FIVE RIGHT,  LUMBAR, WITH EXTRADURAL LESION EXCISION (Right) Patient reports pain as mild.   Reports radicular pain resolved. Back pain well controlled with pain meds. Was dizzy s/p anesthesia yesterday, which has resolved. Voiding without difficulty. No other c/o. Feels ready to go home.  Objective: Vital signs in last 24 hours: Temp:  [98 F (36.7 C)-98.8 F (37.1 C)] 98.8 F (37.1 C) (08/15 0758) Pulse Rate:  [64-81] 65 (08/15 0758) Resp:  [8-20] 19 (08/15 0758) BP: (109-139)/(61-82) 135/82 (08/15 0758) SpO2:  [96 %-100 %] 99 % (08/15 0758) Weight:  [69.9 kg] 69.9 kg (08/14 0836)  Intake/Output from previous day: 08/14 0701 - 08/15 0700 In: 1500 [I.V.:1200; IV Piggyback:300] Out: 325 [Urine:225; Blood:100] Intake/Output this shift: No intake/output data recorded.  No results for input(s): HGB in the last 72 hours. No results for input(s): WBC, RBC, HCT, PLT in the last 72 hours. No results for input(s): NA, K, CL, CO2, BUN, CREATININE, GLUCOSE, CALCIUM  in the last 72 hours. No results for input(s): LABPT, INR in the last 72 hours.  Neurologically intact ABD soft Neurovascular intact Sensation intact distally Intact pulses distally Dorsiflexion/Plantar flexion intact Incision: dressing C/D/I and no drainage No cellulitis present Compartment soft No sign of DVT   Assessment/Plan: 1 Day Post-Op Procedure(s) (LRB): CENTRAL LAMINECTOMY LUMBAR SPINE LUMBAR FOUR-FIVE RIGHT,  LUMBAR, WITH EXTRADURAL LESION EXCISION (Right) Advance diet Up with therapy D/C IV fluids Discussed D/C instructions, Lspine precautions, dressing instructions D/C to home  Mitra Duling M Reed Eifert 06/26/2024, 8:27 AM

## 2024-06-28 ENCOUNTER — Encounter: Payer: Self-pay | Admitting: Nurse Practitioner

## 2024-06-29 ENCOUNTER — Other Ambulatory Visit: Payer: Self-pay | Admitting: Nurse Practitioner

## 2024-06-29 DIAGNOSIS — N951 Menopausal and female climacteric states: Secondary | ICD-10-CM

## 2024-06-29 LAB — SURGICAL PATHOLOGY

## 2024-06-29 MED ORDER — ESTRADIOL 0.1 MG/GM VA CREA: 1.0000 g | TOPICAL_CREAM | VAGINAL | 2 refills | Status: DC

## 2024-07-27 DIAGNOSIS — F909 Attention-deficit hyperactivity disorder, unspecified type: Secondary | ICD-10-CM | POA: Diagnosis not present

## 2024-08-13 DIAGNOSIS — R29898 Other symptoms and signs involving the musculoskeletal system: Secondary | ICD-10-CM | POA: Diagnosis not present

## 2024-08-13 DIAGNOSIS — M2569 Stiffness of other specified joint, not elsewhere classified: Secondary | ICD-10-CM | POA: Diagnosis not present

## 2024-08-13 DIAGNOSIS — M545 Low back pain, unspecified: Secondary | ICD-10-CM | POA: Diagnosis not present

## 2024-08-17 DIAGNOSIS — M545 Low back pain, unspecified: Secondary | ICD-10-CM | POA: Diagnosis not present

## 2024-08-17 DIAGNOSIS — R29898 Other symptoms and signs involving the musculoskeletal system: Secondary | ICD-10-CM | POA: Diagnosis not present

## 2024-08-17 DIAGNOSIS — M2569 Stiffness of other specified joint, not elsewhere classified: Secondary | ICD-10-CM | POA: Diagnosis not present

## 2024-08-21 DIAGNOSIS — R29898 Other symptoms and signs involving the musculoskeletal system: Secondary | ICD-10-CM | POA: Diagnosis not present

## 2024-08-21 DIAGNOSIS — M2569 Stiffness of other specified joint, not elsewhere classified: Secondary | ICD-10-CM | POA: Diagnosis not present

## 2024-08-21 DIAGNOSIS — M545 Low back pain, unspecified: Secondary | ICD-10-CM | POA: Diagnosis not present

## 2024-08-27 DIAGNOSIS — M545 Low back pain, unspecified: Secondary | ICD-10-CM | POA: Diagnosis not present

## 2024-08-27 DIAGNOSIS — M2569 Stiffness of other specified joint, not elsewhere classified: Secondary | ICD-10-CM | POA: Diagnosis not present

## 2024-08-27 DIAGNOSIS — R29898 Other symptoms and signs involving the musculoskeletal system: Secondary | ICD-10-CM | POA: Diagnosis not present

## 2024-09-03 DIAGNOSIS — M545 Low back pain, unspecified: Secondary | ICD-10-CM | POA: Diagnosis not present

## 2024-09-03 DIAGNOSIS — M2569 Stiffness of other specified joint, not elsewhere classified: Secondary | ICD-10-CM | POA: Diagnosis not present

## 2024-09-03 DIAGNOSIS — R29898 Other symptoms and signs involving the musculoskeletal system: Secondary | ICD-10-CM | POA: Diagnosis not present

## 2024-09-07 DIAGNOSIS — R29898 Other symptoms and signs involving the musculoskeletal system: Secondary | ICD-10-CM | POA: Diagnosis not present

## 2024-09-07 DIAGNOSIS — M545 Low back pain, unspecified: Secondary | ICD-10-CM | POA: Diagnosis not present

## 2024-09-07 DIAGNOSIS — M2569 Stiffness of other specified joint, not elsewhere classified: Secondary | ICD-10-CM | POA: Diagnosis not present

## 2024-09-10 DIAGNOSIS — R29898 Other symptoms and signs involving the musculoskeletal system: Secondary | ICD-10-CM | POA: Diagnosis not present

## 2024-09-10 DIAGNOSIS — M545 Low back pain, unspecified: Secondary | ICD-10-CM | POA: Diagnosis not present

## 2024-09-10 DIAGNOSIS — M2569 Stiffness of other specified joint, not elsewhere classified: Secondary | ICD-10-CM | POA: Diagnosis not present

## 2024-09-24 DIAGNOSIS — M545 Low back pain, unspecified: Secondary | ICD-10-CM | POA: Diagnosis not present

## 2024-09-24 DIAGNOSIS — R29898 Other symptoms and signs involving the musculoskeletal system: Secondary | ICD-10-CM | POA: Diagnosis not present

## 2024-09-24 DIAGNOSIS — M2569 Stiffness of other specified joint, not elsewhere classified: Secondary | ICD-10-CM | POA: Diagnosis not present

## 2024-10-02 ENCOUNTER — Other Ambulatory Visit: Payer: Self-pay | Admitting: Nurse Practitioner

## 2024-10-02 DIAGNOSIS — N951 Menopausal and female climacteric states: Secondary | ICD-10-CM

## 2024-10-05 NOTE — Telephone Encounter (Signed)
 Med refill request:      progesterone  (PROMETRIUM ) 100 MG capsule  Start:  02/11/24 Disp:   90 capsules  Refills:  2  Last OV:  02/11/24 Last AEX:  11/12/23 Next AEX:  11/18/24 Last MMG (if hormonal med):  10/15/23 Refill authorized? Please Advise.

## 2024-10-11 ENCOUNTER — Encounter: Payer: Self-pay | Admitting: Nurse Practitioner

## 2024-10-11 DIAGNOSIS — N951 Menopausal and female climacteric states: Secondary | ICD-10-CM

## 2024-10-12 ENCOUNTER — Other Ambulatory Visit: Payer: Self-pay | Admitting: Nurse Practitioner

## 2024-10-12 DIAGNOSIS — Z79890 Hormone replacement therapy (postmenopausal): Secondary | ICD-10-CM

## 2024-10-12 DIAGNOSIS — F32A Depression, unspecified: Secondary | ICD-10-CM | POA: Diagnosis not present

## 2024-10-12 DIAGNOSIS — E785 Hyperlipidemia, unspecified: Secondary | ICD-10-CM | POA: Diagnosis not present

## 2024-10-12 DIAGNOSIS — Z0000 Encounter for general adult medical examination without abnormal findings: Secondary | ICD-10-CM | POA: Diagnosis not present

## 2024-10-12 DIAGNOSIS — F909 Attention-deficit hyperactivity disorder, unspecified type: Secondary | ICD-10-CM | POA: Diagnosis not present

## 2024-10-12 MED ORDER — PROGESTERONE 200 MG PO CAPS: 200.0000 mg | ORAL_CAPSULE | Freq: Every evening | ORAL | 0 refills | Status: DC

## 2024-10-12 NOTE — Telephone Encounter (Signed)
 Last Rx sent for Progesterone  100 mg PO daily on 10/05/24, #90/0RF.   Last AEX 11/12/23  Reviewed 02/11/24 OV, may increase Prometrium  is symptoms persist after another 1-2 weeks.   Tiffany -please review patient request to increase and advise.

## 2024-11-18 ENCOUNTER — Ambulatory Visit: Admitting: Nurse Practitioner

## 2024-11-18 ENCOUNTER — Encounter: Payer: Self-pay | Admitting: Nurse Practitioner

## 2024-11-18 ENCOUNTER — Other Ambulatory Visit (HOSPITAL_COMMUNITY)
Admission: RE | Admit: 2024-11-18 | Discharge: 2024-11-18 | Disposition: A | Source: Ambulatory Visit | Attending: Nurse Practitioner | Admitting: Nurse Practitioner

## 2024-11-18 VITALS — BP 116/74 | HR 68 | Resp 16 | Ht 65.75 in | Wt 153.0 lb

## 2024-11-18 DIAGNOSIS — N951 Menopausal and female climacteric states: Secondary | ICD-10-CM | POA: Diagnosis not present

## 2024-11-18 DIAGNOSIS — Z124 Encounter for screening for malignant neoplasm of cervix: Secondary | ICD-10-CM | POA: Diagnosis present

## 2024-11-18 DIAGNOSIS — Z1331 Encounter for screening for depression: Secondary | ICD-10-CM

## 2024-11-18 DIAGNOSIS — Z01419 Encounter for gynecological examination (general) (routine) without abnormal findings: Secondary | ICD-10-CM | POA: Insufficient documentation

## 2024-11-18 DIAGNOSIS — Z79890 Hormone replacement therapy (postmenopausal): Secondary | ICD-10-CM | POA: Diagnosis not present

## 2024-11-18 MED ORDER — IMVEXXY MAINTENANCE PACK 10 MCG VA INST: 1.0000 | VAGINAL_INSERT | VAGINAL | 3 refills | Status: AC

## 2024-11-18 MED ORDER — PROGESTERONE 200 MG PO CAPS: 200.0000 mg | ORAL_CAPSULE | Freq: Every evening | ORAL | 3 refills | Status: AC

## 2024-11-18 NOTE — Progress Notes (Signed)
 "  Teresa Christensen [facility] 12-26-1960 [id number]   History:  64 y.o. [id number] presents for annual exam. Postmenopausal - on HRT. Prometrium  nightly. Using vaginal estrogen cream for dryness but is not consistent with use due to messiness. 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 HLD, anxiety managed by PCP. Wants to stop anxiety medications. Had back surgery this year and was told to stop taking. Tolerated discontinuing and did not notice worsening anxiety symptoms. Will talk to PCP about this since they manage.   Gynecologic History Patient's last menstrual period was 06/12/2010.   Contraception/Family planning: post menopausal status Sexually active: Yes  Health Maintenance Last Pap: 11/12/2023. Results were: LGSIL + HR HPV Last mammogram: 09/23/2023. Results were: Possible left breast mass, follow up imaging showed cyst Last colonoscopy: 2023 per patient.  Results were: Polyps, 5-year recall Last Dexa: Never     11/18/2024   12:10 PM  Depression screen PHQ 2/9  Decreased Interest 0  Down, Depressed, Hopeless 0  PHQ - 2 Score 0     Past medical history, past surgical history, family history and social history were all reviewed and documented in the EPIC chart. Engaged. 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 2 daughters, both live in [place].   ROS:  A ROS was performed and pertinent positives and negatives are included.  Exam:  Vitals:   11/18/24 1200  BP: 116/74  Pulse: 68  Resp: 16  Weight: 153 lb (69.4 kg)  Height: 5' 5.75 (1.67 m)       Body mass index is 24.88 kg/m.  General appearance:  Normal Thyroid:  Symmetrical, normal in size, without palpable masses or nodularity. Respiratory  Auscultation:  Clear without wheezing or rhonchi Cardiovascular  Auscultation:  Regular rate, without rubs, murmurs or gallops  Edema/varicosities:  Not grossly evident Abdominal  Soft,nontender, without masses, guarding or  rebound.  Liver/spleen:  No organomegaly noted  Hernia:  None appreciated  Skin  Inspection:  Grossly normal   Breasts: Examined lying and sitting.   Right: Without masses, retractions, discharge or axillary adenopathy.   Left: Without masses, retractions, discharge or axillary adenopathy. Pelvic: External genitalia:  no lesions              Urethra:  normal appearing urethra with no masses, tenderness or lesions              Bartholins and Skenes: normal                 Vagina: normal appearing vagina with normal color and discharge, no lesions. Mild atrophic changes              Cervix: no lesions.  Bimanual Exam:  Uterus:  no masses or tenderness              Adnexa: no mass, fullness, tenderness              Rectovaginal: Deferred              Anus:  normal, no lesions  Teresa Christensen, CMA present as chaperone.   Assessment/Plan:  64 y.o. [id number] for annual exam.   Well female exam with routine gynecological exam - Education provided on SBEs, importance of preventative screenings, current guidelines, high calcium  diet, regular exercise, and multivitamin daily. Labs with PCP.   Depression screening - PHQ - 0  Cervical cancer screening - Plan: Cytology - PAP( [place]). 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 Pap today per guidelines.   Hormone replacement therapy - Plan: progesterone  (PROMETRIUM ) 200 MG capsule nightly. Doing well on this and wants to continue.   Menopausal vaginal dryness - Plan: Estradiol  (IMVEXXY  MAINTENANCE PACK) 10 MCG INST twice weekly. Tried cream.   Screening for breast cancer - Overdue for mammogram and encouraged to schedule. Normal breast exam today.  Screening for colon cancer - 2023 colonoscopy. Will repeat at 5-year interval per GI recommendation.   Screening for osteoporosis - Normal bone density in 2019. Will repeat at 5-year interval per recommendation.   Return in about 1 year (around 11/18/2025)  for Annual.        Teresa Christensen [facility], 12:36 PM 11/18/2024 "

## 2024-11-20 ENCOUNTER — Ambulatory Visit: Payer: Self-pay | Admitting: Nurse Practitioner

## 2024-11-20 DIAGNOSIS — R87622 Low grade squamous intraepithelial lesion on cytologic smear of vagina (LGSIL): Secondary | ICD-10-CM

## 2024-11-23 NOTE — Progress Notes (Signed)
 Call placed to lab to request test to be added. Awaiting fax for add on form.

## 2024-11-24 LAB — CYTOLOGY - PAP
Comment: NEGATIVE
Comment: NEGATIVE
Comment: NEGATIVE
High risk HPV: POSITIVE — AB

## 2024-12-17 ENCOUNTER — Encounter: Payer: Self-pay | Admitting: Nurse Practitioner

## 2024-12-17 ENCOUNTER — Ambulatory Visit: Admitting: Nurse Practitioner

## 2024-12-17 VITALS — BP 122/78 | HR 88

## 2024-12-17 DIAGNOSIS — R8789 Other abnormal findings in specimens from female genital organs: Secondary | ICD-10-CM

## 2024-12-17 DIAGNOSIS — R87622 Low grade squamous intraepithelial lesion on cytologic smear of vagina (LGSIL): Secondary | ICD-10-CM | POA: Diagnosis not present

## 2024-12-17 NOTE — Progress Notes (Signed)
" ° °   Patient ID: Teresa Christensen, female    DOB: 07/08/1961, 64 y.o.   MRN: [id number]  Colposcopy Procedure Note Kahlia Lagunes [facility] 12/17/2024  Indications: LGSIL + HR HPV x 2 neg 16/18/45. Laser vaporization for CIN-3 ~20 years ago.    Procedure Details  Colposcopy - cervix The risks and benefits of the procedure and written informed consent obtained. Timeout performed.  Speculum placed in vagina and excellent visualization of cervix achieved, cervix swabbed x 3 with acetic acid solution.  White light and green light filter used.  Dr. Dallie present for observation.   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   Complications:    Patient tolerated the procedure well,  Plan: Will notify patient of results and plan of care.   Annabella DELENA Shutter DNP, 3:09 PM 12/17/2024  "
